# Patient Record
Sex: Male | Born: 1955 | Race: White | Hispanic: No | Marital: Married | Smoking: Former smoker
Health system: Southern US, Community
[De-identification: ages and names within clinical notes are randomized; demographics above are authoritative.]

## PROBLEM LIST (undated history)

## (undated) DIAGNOSIS — L309 Dermatitis, unspecified: Secondary | ICD-10-CM

## (undated) DIAGNOSIS — I10 Essential (primary) hypertension: Secondary | ICD-10-CM

## (undated) DIAGNOSIS — K900 Celiac disease: Secondary | ICD-10-CM

## (undated) DIAGNOSIS — R1033 Periumbilical pain: Secondary | ICD-10-CM

## (undated) DIAGNOSIS — K5090 Crohn's disease, unspecified, without complications: Secondary | ICD-10-CM

## (undated) DIAGNOSIS — K219 Gastro-esophageal reflux disease without esophagitis: Secondary | ICD-10-CM

## (undated) DIAGNOSIS — R5383 Other fatigue: Secondary | ICD-10-CM

## (undated) HISTORY — DX: Gastro-esophageal reflux disease without esophagitis: K21.9

## (undated) HISTORY — DX: Periumbilical pain: R10.33

## (undated) HISTORY — PX: BACK SURGERY: SHX140

## (undated) HISTORY — DX: Essential (primary) hypertension: I10

## (undated) HISTORY — DX: Dermatitis, unspecified: L30.9

## (undated) HISTORY — DX: Other fatigue: R53.83

## (undated) HISTORY — PX: CHOLECYSTECTOMY: SHX55

## (undated) HISTORY — PX: APPENDECTOMY: SHX54

## (undated) HISTORY — DX: Celiac disease: K90.0

---

## 1997-12-15 ENCOUNTER — Ambulatory Visit (HOSPITAL_COMMUNITY): Admission: RE | Admit: 1997-12-15 | Discharge: 1997-12-15 | Payer: Self-pay | Admitting: Neurosurgery

## 1998-01-31 ENCOUNTER — Ambulatory Visit (HOSPITAL_COMMUNITY): Admission: RE | Admit: 1998-01-31 | Discharge: 1998-01-31 | Payer: Self-pay | Admitting: Neurosurgery

## 2003-08-18 ENCOUNTER — Ambulatory Visit (HOSPITAL_COMMUNITY): Admission: RE | Admit: 2003-08-18 | Discharge: 2003-08-19 | Payer: Self-pay | Admitting: Neurosurgery

## 2013-09-07 HISTORY — PX: COLONOSCOPY: SHX174

## 2013-11-22 HISTORY — PX: FLEXIBLE SIGMOIDOSCOPY: SHX1649

## 2014-06-13 ENCOUNTER — Encounter (INDEPENDENT_AMBULATORY_CARE_PROVIDER_SITE_OTHER): Payer: Self-pay | Admitting: *Deleted

## 2014-06-21 ENCOUNTER — Encounter (INDEPENDENT_AMBULATORY_CARE_PROVIDER_SITE_OTHER): Payer: Self-pay | Admitting: *Deleted

## 2014-06-26 ENCOUNTER — Institutional Professional Consult (permissible substitution) (INDEPENDENT_AMBULATORY_CARE_PROVIDER_SITE_OTHER): Payer: Self-pay | Admitting: Internal Medicine

## 2014-08-01 ENCOUNTER — Encounter (INDEPENDENT_AMBULATORY_CARE_PROVIDER_SITE_OTHER): Payer: Self-pay | Admitting: Internal Medicine

## 2014-08-01 ENCOUNTER — Ambulatory Visit (INDEPENDENT_AMBULATORY_CARE_PROVIDER_SITE_OTHER): Payer: BLUE CROSS/BLUE SHIELD | Admitting: Internal Medicine

## 2014-08-01 VITALS — BP 116/76 | HR 74 | Temp 97.6°F | Resp 18 | Ht 67.0 in | Wt 194.9 lb

## 2014-08-01 DIAGNOSIS — I10 Essential (primary) hypertension: Secondary | ICD-10-CM | POA: Insufficient documentation

## 2014-08-01 DIAGNOSIS — M1710 Unilateral primary osteoarthritis, unspecified knee: Secondary | ICD-10-CM | POA: Insufficient documentation

## 2014-08-01 DIAGNOSIS — M179 Osteoarthritis of knee, unspecified: Secondary | ICD-10-CM | POA: Insufficient documentation

## 2014-08-01 DIAGNOSIS — Z8719 Personal history of other diseases of the digestive system: Secondary | ICD-10-CM | POA: Insufficient documentation

## 2014-08-01 DIAGNOSIS — K5732 Diverticulitis of large intestine without perforation or abscess without bleeding: Secondary | ICD-10-CM | POA: Insufficient documentation

## 2014-08-01 DIAGNOSIS — K219 Gastro-esophageal reflux disease without esophagitis: Secondary | ICD-10-CM | POA: Insufficient documentation

## 2014-08-01 NOTE — Patient Instructions (Addendum)
Physician will contact you after endoscopic pictures reviewed. Keep ibuprofen or Motrin used a minimum;

## 2014-08-01 NOTE — Progress Notes (Signed)
Reason for consultation.  History of abdominal pain and absent normal findings on colonoscopy initially thought to be colon cancer.  History of present illness;  Nicholas Ellison 59-year-old Caucasian male who was referred for car seat of Dr. Whitney Muse for GI evaluation. A shunt is accompanied by his daughter who is an Therapist, sports at [employer]. Patient states his present illness started in March 2015 when he began to notice blood with his bowel movements small in amounts and bright red in color. He also started to have left lower quadrant abdominal pain which generally would occur after meals and associated with diarrhea. He was evaluated by Dr. Lance Bosch and referred to Dr. Doristine Mango for colonoscopy. He underwent colonoscopy on 09/07/2013 and was told that he had colon cancer based on visual/endoscopic impression. Biopsy from this area revealed acute colitis with mild architectural changes and hyperemia but no tumor was identified. Patient was treated with antibiotics and he was brought back for flexible sigmoidoscopy which was performed on 11/23/2014. This time Dr. Britta Mccreedy noted inflammation in the sigmoid colon with thickened folds associated submucosal hemorrhages and moderate diverticulosis. Biopsy was taken again revealing mild inflammation is felt to be nonspecific. Patient was retreated with Cipro and Flagyl with resolution of his abdominal pain. He he is not passing blood per rectum anymore. Patient, his wife and the daughter remain very concerned about discrepancy in diagnosis and they want to be absolutely certain that there is no malignancy. He has mild intermittent postprandial LLQ abdominal pain but nothing like last year. He has good appetite and his weight has been stable. He has been taking probiotic. He has left knee pain. He uses Motrin sporadically. His daughter believes he was taking NSAIDs quite regularly when his symptoms started last year. He denies heartburn nausea vomiting hematuria or  dysuria.   Current Medications: Outpatient Encounter Prescriptions as of 08/01/2014  Medication Sig  . ibuprofen (ADVIL,MOTRIN) 200 MG tablet Take 200 mg by mouth as needed.  [name] losartan (COZAAR) 50 MG tablet Take 50 mg by mouth daily.  [name] omeprazole (PRILOSEC) 20 MG capsule Take 20 mg by mouth daily.  . Probiotic Product (PROBIOTIC PO) Take by mouth daily. Gummy form   Past medical history;  He had appendicectomy over 60 years ago. Cholecystectomy for cholelithiasis several years ago. History of kidney stones. He has passed stones but on one occasion he had ureteral stenting. GERD of 20 years duration. Hypertension of 5 venous duration. Left knee osteoarthrosis. Status post arthroscopy in 2015. Neck surgery for disc disease. Lumbar spine surgery for disc disease. Had few small skin cancers removed from his face. Left plantar fasciitis  Allergies;  Allergies  Allergen Reactions  . Sulfa Antibiotics Itching    Family history;  Father is 60 years old and has hypertension. Mother is 60 and has hypertension and diabetes. He has 1 brother with hypertension. He has 4 sisters. 3 sisters are in good health. For sister died of metastatic carcinoma at age 70 within few months of diagnosis.  Social history;  He is married and has one daughter in good health. He smokes cigarettes for 35 years about a pack a day but quit 6 years ago. He used to drink alcohol socially but not daily but he hasn't had any in 5 areas. 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  Physical exam; Blood pressure 116/76, pulse 74, temperature 97.6 F (36.4 C), temperature source Oral, resp. rate  18, height 5' 7"  (1.702 m), weight 194 lb 14.4 oz (88.406 kg). Patient is alert and in no acute distress. Conjunctiva is pink. Sclera is nonicteric Oropharyngeal mucosa is normal. No neck masses or thyromegaly noted. Cardiac exam with regular rhythm  normal S1 and S2. No murmur or gallop noted. Lungs are clear to auscultation. Abdomen is full. Bowel sounds are normal. No bruits noted. On palpation abdomen is soft with minimal tenderness in left lower quadrant on deep palpation. No organomegaly or masses.  No LE edema or clubbing noted.  Labs/studies Results:   lab data from 02/10/14 reviewed; WBC 6.9, H&H 14.0 and 39.2 and platelet count 285K   Colonoscopy and flexible sigmoidoscopy notes reviewed. Pictures in black and white and difficult to discern.  Assessment:  Patient is 59 year old Caucasian male who presented last year with left lower quadrant abdominal pain and hematochezia and on colonoscopy was thought to have colonic cancer but biopsy revealed colitis. This exam was in May 2015. He was treated with antibiotics and follow-up flexible sigmoidoscopy about 10 weeks later suggested changes of diverticulitis. He was retreated with antibiotics with near complete resolution of his pain. He is very concerned because initially he and his family were told that he had colon cancer. I suspect colonoscopy revealed pseudo or inflammatory mass may mimicking neoplasm but none found on follow-up study. Since this has left some doubt in patient and his family, he should undergo repeat flexible sigmoidoscopy to resolve his confusion. In hindsight I would think that he had both colitis and diverticulitis possibly caused by NSAID therapy.   Recommendations;  Patient advised to keep ibuprofen used a minimum. Will review endoscopic pictures. Will arrange for diagnostic flexible sigmoidoscopy with sedation and near future.

## 2014-08-08 ENCOUNTER — Other Ambulatory Visit (INDEPENDENT_AMBULATORY_CARE_PROVIDER_SITE_OTHER): Payer: Self-pay | Admitting: Internal Medicine

## 2014-08-08 DIAGNOSIS — K5732 Diverticulitis of large intestine without perforation or abscess without bleeding: Secondary | ICD-10-CM

## 2014-08-08 DIAGNOSIS — R109 Unspecified abdominal pain: Secondary | ICD-10-CM

## 2014-08-10 ENCOUNTER — Encounter (INDEPENDENT_AMBULATORY_CARE_PROVIDER_SITE_OTHER): Payer: Self-pay | Admitting: *Deleted

## 2014-08-10 ENCOUNTER — Telehealth (INDEPENDENT_AMBULATORY_CARE_PROVIDER_SITE_OTHER): Payer: Self-pay | Admitting: *Deleted

## 2014-08-10 ENCOUNTER — Ambulatory Visit (HOSPITAL_COMMUNITY)
Admission: RE | Admit: 2014-08-10 | Discharge: 2014-08-10 | Disposition: A | Discharge status: Home / Self Care | Payer: BLUE CROSS/BLUE SHIELD | Source: Ambulatory Visit | Attending: Internal Medicine | Admitting: Internal Medicine

## 2014-08-10 ENCOUNTER — Other Ambulatory Visit (INDEPENDENT_AMBULATORY_CARE_PROVIDER_SITE_OTHER): Payer: Self-pay | Admitting: *Deleted

## 2014-08-10 DIAGNOSIS — K760 Fatty (change of) liver, not elsewhere classified: Secondary | ICD-10-CM | POA: Diagnosis not present

## 2014-08-10 DIAGNOSIS — R935 Abnormal findings on diagnostic imaging of other abdominal regions, including retroperitoneum: Secondary | ICD-10-CM

## 2014-08-10 DIAGNOSIS — R1030 Lower abdominal pain, unspecified: Secondary | ICD-10-CM | POA: Diagnosis present

## 2014-08-10 DIAGNOSIS — R599 Enlarged lymph nodes, unspecified: Secondary | ICD-10-CM | POA: Insufficient documentation

## 2014-08-10 DIAGNOSIS — R109 Unspecified abdominal pain: Secondary | ICD-10-CM

## 2014-08-10 DIAGNOSIS — Z8719 Personal history of other diseases of the digestive system: Secondary | ICD-10-CM | POA: Insufficient documentation

## 2014-08-10 DIAGNOSIS — R933 Abnormal findings on diagnostic imaging of other parts of digestive tract: Secondary | ICD-10-CM | POA: Insufficient documentation

## 2014-08-10 DIAGNOSIS — Z1211 Encounter for screening for malignant neoplasm of colon: Secondary | ICD-10-CM

## 2014-08-10 DIAGNOSIS — I2510 Atherosclerotic heart disease of native coronary artery without angina pectoris: Secondary | ICD-10-CM | POA: Insufficient documentation

## 2014-08-10 DIAGNOSIS — K5730 Diverticulosis of large intestine without perforation or abscess without bleeding: Secondary | ICD-10-CM | POA: Insufficient documentation

## 2014-08-10 DIAGNOSIS — K5732 Diverticulitis of large intestine without perforation or abscess without bleeding: Secondary | ICD-10-CM

## 2014-08-10 MED ORDER — IOHEXOL 300 MG/ML  SOLN
100.0000 mL | Freq: Once | INTRAMUSCULAR | Status: AC | PRN
  Administered 2014-08-10: 100 mL via INTRAVENOUS

## 2014-08-10 MED ORDER — PEG 3350-KCL-NA BICARB-NACL 420 G PO SOLR: 4000.0000 mL | Freq: Once | ORAL | Status: DC

## 2014-08-10 NOTE — Telephone Encounter (Signed)
Patients needs trilyte

## 2014-08-14 ENCOUNTER — Encounter (HOSPITAL_COMMUNITY)
Admission: RE | Disposition: A | Discharge status: Home / Self Care | Payer: Self-pay | Source: Ambulatory Visit | Attending: Internal Medicine

## 2014-08-14 ENCOUNTER — Ambulatory Visit (HOSPITAL_COMMUNITY)
Admission: RE | Admit: 2014-08-14 | Discharge: 2014-08-14 | Disposition: A | Discharge status: Home / Self Care | Payer: BLUE CROSS/BLUE SHIELD | Source: Ambulatory Visit | Attending: Internal Medicine | Admitting: Internal Medicine

## 2014-08-14 ENCOUNTER — Encounter (HOSPITAL_COMMUNITY): Payer: Self-pay | Admitting: *Deleted

## 2014-08-14 DIAGNOSIS — Z79899 Other long term (current) drug therapy: Secondary | ICD-10-CM | POA: Insufficient documentation

## 2014-08-14 DIAGNOSIS — R1030 Lower abdominal pain, unspecified: Secondary | ICD-10-CM | POA: Diagnosis present

## 2014-08-14 DIAGNOSIS — I10 Essential (primary) hypertension: Secondary | ICD-10-CM | POA: Insufficient documentation

## 2014-08-14 DIAGNOSIS — K529 Noninfective gastroenteritis and colitis, unspecified: Secondary | ICD-10-CM | POA: Insufficient documentation

## 2014-08-14 DIAGNOSIS — K648 Other hemorrhoids: Secondary | ICD-10-CM | POA: Diagnosis not present

## 2014-08-14 DIAGNOSIS — K6389 Other specified diseases of intestine: Secondary | ICD-10-CM | POA: Diagnosis not present

## 2014-08-14 DIAGNOSIS — K644 Residual hemorrhoidal skin tags: Secondary | ICD-10-CM | POA: Diagnosis not present

## 2014-08-14 DIAGNOSIS — K219 Gastro-esophageal reflux disease without esophagitis: Secondary | ICD-10-CM | POA: Diagnosis not present

## 2014-08-14 DIAGNOSIS — R933 Abnormal findings on diagnostic imaging of other parts of digestive tract: Secondary | ICD-10-CM | POA: Diagnosis not present

## 2014-08-14 DIAGNOSIS — K5730 Diverticulosis of large intestine without perforation or abscess without bleeding: Secondary | ICD-10-CM | POA: Insufficient documentation

## 2014-08-14 DIAGNOSIS — R935 Abnormal findings on diagnostic imaging of other abdominal regions, including retroperitoneum: Secondary | ICD-10-CM

## 2014-08-14 HISTORY — PX: COLONOSCOPY: SHX5424

## 2014-08-14 SURGERY — COLONOSCOPY
Anesthesia: Moderate Sedation

## 2014-08-14 MED ORDER — SIMETHICONE 40 MG/0.6ML PO SUSP
ORAL | Status: DC | PRN
  Administered 2014-08-14: 08:00:00

## 2014-08-14 MED ORDER — MEPERIDINE HCL 50 MG/ML IJ SOLN
INTRAMUSCULAR | Status: DC | PRN
  Administered 2014-08-14 (×2): 25 mg via INTRAVENOUS

## 2014-08-14 MED ORDER — SODIUM CHLORIDE 0.9 % IV SOLN
INTRAVENOUS | Status: DC
  Administered 2014-08-14: 1000 mL via INTRAVENOUS

## 2014-08-14 MED ORDER — MIDAZOLAM HCL 5 MG/5ML IJ SOLN
INTRAMUSCULAR | Status: AC
  Filled 2014-08-14: qty 10

## 2014-08-14 MED ORDER — MEPERIDINE HCL 50 MG/ML IJ SOLN
INTRAMUSCULAR | Status: AC
  Filled 2014-08-14: qty 1

## 2014-08-14 MED ORDER — DOCUSATE SODIUM 100 MG PO CAPS: 200.0000 mg | ORAL_CAPSULE | Freq: Every day | ORAL | Status: DC

## 2014-08-14 MED ORDER — BENEFIBER DRINK MIX PO PACK: 4.0000 g | PACK | Freq: Every day | ORAL | Status: DC

## 2014-08-14 MED ORDER — MIDAZOLAM HCL 5 MG/5ML IJ SOLN
INTRAMUSCULAR | Status: DC | PRN
  Administered 2014-08-14 (×3): 2 mg via INTRAVENOUS

## 2014-08-14 NOTE — H&P (Signed)
Nicholas Ellison is an 59 y.o. male.   Chief Complaint: Patient is here for colonoscopy. HPI: Patient is 59 year-old Caucasian male who presented with lower abdominal pain and rectal bleeding last year underwent colonoscopy. He was felt to have colonic neoplasm is an endoscopic findings with biopsy showed colitis. He was treated with antibiotics his symptoms at all. He had follow-up flexible sigmoidoscopy in July and this time felt to have diverticulitis. Patient is referred to her office for another opinion. I reviewed patient's prior CT and felt abnormality was significant enough to be followed up. On repeat CT has persistent wall thickening and sigmoid colon and prominent lymph nodes. He has had intermittent mild lower abdominal pain but denies rectal bleeding anorexia or weight loss.  Past Medical History  Diagnosis Date  . Hypertension   . GERD (gastroesophageal reflux disease)   . Fatigue   . Abdominal pain, periumbilical   . Eczema     Past Surgical History  Procedure Laterality Date  . Appendectomy    . Back surgery      Cervical Neck Fusion , Low Back  . Cholecystectomy    . Flexible sigmoidoscopy  11/22/13  . Colonoscopy  09/07/13    Family History  Problem Relation Age of Onset  . Hypertension Mother   . Diabetes Mother   . Hypertension Father   . Healthy Sister   . Healthy Brother   . Healthy Paternal Grandfather   . Healthy Sister   . Healthy Sister   . Bone cancer Sister    Social History:  reports that he has quit smoking. He started smoking about 6 years ago. His smokeless tobacco use includes Snuff. He reports that he does not drink alcohol or use illicit drugs.  Allergies:  Allergies  Allergen Reactions  . Sulfa Antibiotics Itching    Medications Prior to Admission  Medication Sig Dispense Refill  . ibuprofen (ADVIL,MOTRIN) 200 MG tablet Take 200 mg by mouth as needed.    [name] losartan (COZAAR) 50 MG tablet Take 50 mg by mouth daily.    [name] omeprazole  (PRILOSEC) 20 MG capsule Take 20 mg by mouth daily.    . polyethylene glycol-electrolytes (NULYTELY/GOLYTELY) 420 G solution Take 4,000 mLs by mouth once. 4000 mL 0  . Probiotic Product (PROBIOTIC PO) Take by mouth daily. Gummy form      No results found for this or any previous visit (from the past 48 hour(s)). No results found.  ROS  Blood pressure 146/89, pulse 77, temperature 97.7 F (36.5 C), temperature source Oral, resp. rate 18, SpO2 95 %. Physical Exam  Constitutional: He appears well-developed and well-nourished.  HENT:  Mouth/Throat: Oropharynx is clear and moist.  Eyes: Conjunctivae are normal. No scleral icterus.  Neck: No thyromegaly present.  Cardiovascular: Normal rate, regular rhythm and normal heart sounds.   No murmur heard. Respiratory: Effort normal and breath sounds normal.  GI: Soft. He exhibits no distension and no mass. There is no tenderness.  Musculoskeletal: He exhibits no edema.  Lymphadenopathy:    He has no cervical adenopathy.  Neurological: He is alert.  Skin: Skin is warm and dry.     Assessment/Plan Lower abdominal pain. Distant CT abnormality and sigmoid colon suspicious for carcinoma. Diagnostic colonoscopy.  REHMAN,NAJEEB U 08/14/2014, 7:33 AM

## 2014-08-14 NOTE — Op Note (Signed)
COLONOSCOPY PROCEDURE REPORT  PATIENT:  Nicholas Ellison  MR#:  [id number] Birthdate:  [birth year], 59 y.o., male Endoscopist:  Dr. Rogene Houston, MD Referred By:  Dr. Manon Hilding, MD Procedure Date: 08/14/2014  Procedure:   Colonoscopy  Indications: Patient is 59 year old Caucasian male who has intermittent lower abdominal pain and persistent abnormality in sigmoid colon on CT. 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 Follow-up flexible sigmoidoscopy in July 2015 revealed changes of diverticulitis([facility]).  Informed Consent:  The procedure and risks were reviewed with the patient and informed consent was obtained.  Medications:  Demerol 50 mg IV Versed 6 mg IV  Description of procedure:  After a digital rectal exam was performed, that colonoscope was advanced from the anus through the rectum and colon to the area of the cecum, ileocecal valve and appendiceal orifice. The cecum was deeply intubated. These structures were well-seen and photographed for the record. From the level of the cecum and ileocecal valve, the scope was slowly and cautiously withdrawn. The mucosal surfaces were carefully surveyed utilizing scope tip to flexion to facilitate fold flattening as needed. The scope was pulled down into the rectum where a thorough exam including retroflexion was performed.  Findings:   Prep satisfactory. He had formed stool and rectum. Multiple diverticula noted at sigmoid colon. Large polypoidal fold with erythematous friable mucosa at 30 cm from anal margin. Multiple biopsies taken. Folds distal to this area revealed edema and erythema but not as pronounced. Multiple biopsies taken. Normal rectal mucosa. Small hemorrhoids below the dentate line along with anal papilla.   Therapeutic/Diagnostic Maneuvers Performed:  See above  Complications:  None  Cecal Withdrawal Time:  24 minutes  Impression:  Examination performed to  cecum. Multiple diverticula at sigmoid colon. Polypoid fold with erythema and friable mucosa at mid sigmoid colon. Endoscopic appearance not consistent with colon carcinoma but suggests focal colitis or submucosal process. Focal colitis noted involving mucosa of distal sigmoid colon distal to this area in question. Small external hemorrhoids and anal papilla.  Comment; No evidence of CRC based on endoscopic findings.   Recommendations:  Standard instructions given. High fiber diet. Benefiber 4 g by mouth daily at bedtime. Colace 200 mg by mouth daily at bedtime. I will contact patient with biopsy results and further recommendations.  REHMAN,NAJEEB U  08/14/2014 8:24 AM  CC: Dr. Manon Hilding, MD & Dr. Rayne Du ref. provider found

## 2014-08-14 NOTE — Discharge Instructions (Signed)
Resume usual medications. Keep ibuprofen use to minimum. Colace 200 mg by mouth daily at bedtime High fiber diet. No driving for 24 hours Physician will call with biopsy results  Colonoscopy, Care After Refer to this sheet in the next few weeks. These instructions provide you with information on caring for yourself after your procedure. Your health care provider may also give you more specific instructions. Your treatment has been planned according to current medical practices, but problems sometimes occur. Call your health care provider if you have any problems or questions after your procedure. WHAT TO EXPECT AFTER THE PROCEDURE  After your procedure, it is typical to have the following:  A small amount of blood in your stool.  Moderate amounts of gas and mild abdominal cramping or bloating. HOME CARE INSTRUCTIONS  Do not drive, operate machinery, or sign important documents for 24 hours.  You may shower and resume your regular physical activities, but move at a slower pace for the first 24 hours.  Take frequent rest periods for the first 24 hours.  Walk around or put a warm pack on your abdomen to help reduce abdominal cramping and bloating.  Drink enough fluids to keep your urine clear or pale yellow.  You may resume your normal diet as instructed by your health care provider. Avoid heavy or fried foods that are hard to digest.  Avoid drinking alcohol for 24 hours or as instructed by your health care provider.  Only take over-the-counter or prescription medicines as directed by your health care provider.  If a tissue sample (biopsy) was taken during your procedure:  Do not take aspirin or blood thinners for 7 days, or as instructed by your health care provider.  Do not drink alcohol for 7 days, or as instructed by your health care provider.  Eat soft foods for the first 24 hours. SEEK MEDICAL CARE IF: You have persistent spotting of blood in your stool 2-3 days after the  procedure. SEEK IMMEDIATE MEDICAL CARE IF:  You have more than a small spotting of blood in your stool.  You pass large blood clots in your stool.  Your abdomen is swollen (distended).  You have nausea or vomiting.  You have a fever.  You have increasing abdominal pain that is not relieved with medicine. Document Released: 12/04/2003 Document Revised: 02/09/2013 Document Reviewed: 12/27/2012 [facility] Patient Information 2015 [place], [place]. This information is not intended to replace advice given to you by your health care provider. Make sure you discuss any questions you have with your health care provider.   High-Fiber Diet Fiber is found in fruits, vegetables, and grains. A high-fiber diet encourages the addition of more whole grains, legumes, fruits, and vegetables in your diet. The recommended amount of fiber for adult males is 38 g per day. For adult females, it is 25 g per day. Pregnant and lactating women should get 28 g of fiber per day. If you have a digestive or bowel problem, ask your caregiver for advice before adding high-fiber foods to your diet. Eat a variety of high-fiber foods instead of only a select few type of foods.  PURPOSE  To increase stool bulk.  To make bowel movements more regular to prevent constipation.  To lower cholesterol.  To prevent overeating. WHEN IS THIS DIET USED?  It may be used if you have constipation and hemorrhoids.  It may be used if you have uncomplicated diverticulosis (intestine condition) and irritable bowel syndrome.  It may be used if you need  help with weight management.  It may be used if you want to add it to your diet as a protective measure against atherosclerosis, diabetes, and cancer. SOURCES OF FIBER  Whole-grain breads and cereals.  Fruits, such as apples, oranges, bananas, berries, prunes, and pears.  Vegetables, such as green peas, carrots, sweet potatoes, beets, broccoli, cabbage, spinach, and  artichokes.  Legumes, such split peas, soy, lentils.  Almonds. FIBER CONTENT IN FOODS Starches and Grains / Dietary Fiber (g)  Cheerios, 1 cup / 3 g  Corn Flakes cereal, 1 cup / 0.7 g  Rice crispy treat cereal, 1 cup / 0.3 g  Instant oatmeal (cooked),  cup / 2 g  Frosted wheat cereal, 1 cup / 5.1 g  Brown, long-grain rice (cooked), 1 cup / 3.5 g  White, long-grain rice (cooked), 1 cup / 0.6 g  Enriched macaroni (cooked), 1 cup / 2.5 g Legumes / Dietary Fiber (g)  Baked beans (canned, plain, or vegetarian),  cup / 5.2 g  Kidney beans (canned),  cup / 6.8 g  Pinto beans (cooked),  cup / 5.5 g Breads and Crackers / Dietary Fiber (g)  Plain or honey graham crackers, 2 squares / 0.7 g  Saltine crackers, 3 squares / 0.3 g  Plain, salted pretzels, 10 pieces / 1.8 g  Whole-wheat bread, 1 slice / 1.9 g  White bread, 1 slice / 0.7 g  Raisin bread, 1 slice / 1.2 g  Plain bagel, 3 oz / 2 g  Flour tortilla, 1 oz / 0.9 g  Corn tortilla, 1 small / 1.5 g  Hamburger or hotdog bun, 1 small / 0.9 g Fruits / Dietary Fiber (g)  Apple with skin, 1 medium / 4.4 g  Sweetened applesauce,  cup / 1.5 g  Banana,  medium / 1.5 g  Grapes, 10 grapes / 0.4 g  Orange, 1 small / 2.3 g  Raisin, 1.5 oz / 1.6 g  Melon, 1 cup / 1.4 g Vegetables / Dietary Fiber (g)  Green beans (canned),  cup / 1.3 g  Carrots (cooked),  cup / 2.3 g  Broccoli (cooked),  cup / 2.8 g  Peas (cooked),  cup / 4.4 g  Mashed potatoes,  cup / 1.6 g  Lettuce, 1 cup / 0.5 g  Corn (canned),  cup / 1.6 g  Tomato,  cup / 1.1 g Document Released: 04/21/2005 Document Revised: 10/21/2011 Document Reviewed: 07/24/2011 [employer] Patient Information 2015 [place], [place]. This information is not intended to replace advice given to you by your health care provider. Make sure you discuss any questions you have with your health care provider.

## 2014-08-17 ENCOUNTER — Encounter (HOSPITAL_COMMUNITY): Payer: Self-pay | Admitting: Internal Medicine

## 2014-08-17 ENCOUNTER — Encounter (INDEPENDENT_AMBULATORY_CARE_PROVIDER_SITE_OTHER): Payer: Self-pay

## 2014-08-21 ENCOUNTER — Encounter (INDEPENDENT_AMBULATORY_CARE_PROVIDER_SITE_OTHER): Payer: Self-pay | Admitting: *Deleted

## 2015-02-07 ENCOUNTER — Encounter (INDEPENDENT_AMBULATORY_CARE_PROVIDER_SITE_OTHER): Payer: Self-pay | Admitting: *Deleted

## 2015-02-07 NOTE — Telephone Encounter (Signed)
This encounter was created in error - please disregard.

## 2015-02-13 ENCOUNTER — Encounter (INDEPENDENT_AMBULATORY_CARE_PROVIDER_SITE_OTHER): Payer: Self-pay | Admitting: Internal Medicine

## 2015-02-13 ENCOUNTER — Ambulatory Visit (INDEPENDENT_AMBULATORY_CARE_PROVIDER_SITE_OTHER): Payer: BLUE CROSS/BLUE SHIELD | Admitting: Internal Medicine

## 2015-02-13 VITALS — BP 126/86 | HR 78 | Temp 97.8°F | Resp 18 | Ht 67.0 in | Wt 185.9 lb

## 2015-02-13 DIAGNOSIS — K50119 Crohn's disease of large intestine with unspecified complications: Secondary | ICD-10-CM | POA: Diagnosis not present

## 2015-02-13 DIAGNOSIS — K50118 Crohn's disease of large intestine with other complication: Secondary | ICD-10-CM

## 2015-02-13 DIAGNOSIS — K5010 Crohn's disease of large intestine without complications: Secondary | ICD-10-CM | POA: Insufficient documentation

## 2015-02-13 NOTE — Patient Instructions (Signed)
PPD to be performed Dr. Edythe Lynn office. Patient will call with results of tests for hepatitis B and C. Will send request to your insurance for Remicade approval as soon as above tests are completed

## 2015-02-13 NOTE — Progress Notes (Signed)
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  Database:  97 59 year old Caucasian male whom I last saw in March this year for lower abdominal pain and abnormal colonoscopy. He was initially thought to have colon carcinoma but biopsies show focal colitis. He underwent flexible sigmoidoscopy suggesting changes of diverticulitis but he did not respond to anti-biotics. Abdominopelvic CT was obtained on 08/10/2014 and revealing abnormality to distal sigmoid colon suspicious for neoplasm. He also had extensive colonic diverticulosis. Another colonoscopy was performed on 08/14/2014 revealing segmental colitis involving sigmoid colon and diverticulosis. Since patient had failed medical therapy and there was concern for submucosal process he was referred to Dr. Cheryll Cockayne of [facility] for sigmoid colon resection. A she was evaluated by hem and subsequently by Dr. Eduard Roux of GI division. Repeat CT not only showed changes in the sigmoid colon but also in the transverse and ascending colon. Therefore there was concern for for Crohn's colitis. Repeat colonoscopy on 12/10/2014 revealed changes in the sigmoid colon but not in a sending the transverse colon and biopsies are reviewed below. Patient was treated with budesonide for several weeks but without symptomatic improvement. He stopped using mesalamine enemas after couple of doses because he could not tolerate them. Patient was advised biologic therapy prior to considering surgery. Patient opted to undergo this treatment in [place] and therefore I was contacted by both Dr. Morton Stall and Dr. Nyoka Cowden to arrange this.  Subjective:  Patient is here for scheduled visit accompanied by his wife. They're both very frustrated because he's been having symptoms since made 2015. He has good appetite but limits his by mouth intake because eating causes abdominal pain and diarrhea. He has lost 9 pounds since his 08/01/2014 visit.  He has anywhere from 5-10 stools per day. Most of her stools are loose. He denies melena or rectal bleeding. He has lower abdominal cramping every day. He did have fever and chills on 02/02/2015 when he another CT at [facility]. Family history is negative for inflammatory bowel disease but his mother had rheumatoid arthritis.   Current Medications: Outpatient Encounter Prescriptions as of 02/13/2015  Medication Sig  . Acetaminophen (TYLENOL PO) Take 375 mg by mouth. Patient will take 2-3 daily when needed only  . losartan (COZAAR) 50 MG tablet Take 50 mg by mouth daily.  [name] omeprazole (PRILOSEC) 20 MG capsule Take 20 mg by mouth daily.  . polyethylene glycol (MIRALAX / GLYCOLAX) packet Take 17 g by mouth daily.  . Probiotic Product (PROBIOTIC PO) Take by mouth daily. Gummy form  . Wheat Dextrin (BENEFIBER DRINK MIX) PACK Take 4 g by mouth at bedtime.  . [DISCONTINUED] docusate sodium (COLACE) 100 MG capsule Take 2 capsules (200 mg total) by mouth daily. (Patient not taking: Reported on 02/13/2015)  . [DISCONTINUED] ibuprofen (ADVIL,MOTRIN) 200 MG tablet Take 200 mg by mouth as needed.   No facility-administered encounter medications on file as of 02/13/2015.     Objective: Blood pressure 126/86, pulse 78, temperature 97.8 F (36.6 C), temperature source Oral, resp. rate 18, height 5' 7"  (1.702 m), weight 185 lb 14.4 oz (84.324 kg).  Patient is alert and in no acute distress. Conjunctiva is pink. Sclera is nonicteric Oropharyngeal mucosa is normal. No neck masses or thyromegaly noted. Cardiac exam with regular rhythm normal S1 and S2. No murmur or gallop noted. Lungs are clear to auscultation. Abdomen is symmetrical. Bowel sounds are normal. He has mild tenderness in left lower quadrant and hypogastric area. No organomegaly or  masses noted.  No LE edema or clubbing noted.  Labs/studies Results: Results of colonic biopsies from 12/10/2014 ([facility]-Dr. Eduard Roux). Right colon biopsy colonic  mucosa with no diagnostic abnormality. Left colon biopsy colonic mucosa with no diagnostic abnormality. Sigmoid colon biopsy. Colonic mucosa with chronic active inflammation most consistent with segmental colitis associated with diverticulosis.    Assessment:  #1. Segmental colitis associated with diverticulosis unresponsive to several week course of budesonide and few doses of rectal mesalamine which she could not tolerate. He previously has also been treated with antibiotics. Biologic therapy was advised by Dr. Cheryll Cockayne and Dr. Eduard Roux which he would prefer to have locally. Once is documented to have negative PPD and hepatitis B surface antigen is negative will send request for medication to his insurance.    Plan:  Patient will go to Dr. Edythe Lynn office for PPD. Check hepatitis B surface antigen and HCV antibody. Once these tests are completed and are negative will send request to is insurance for infliximab for induction and maintenance therapy. Office visit in 8 weeks at which time he would hopefully have completed 3 doses for induction therapy.

## 2015-02-14 LAB — HEPATITIS C ANTIBODY: HCV AB: NEGATIVE

## 2015-02-14 LAB — HEPATITIS B SURFACE ANTIGEN: HEP B S AG: NEGATIVE

## 2015-02-19 ENCOUNTER — Other Ambulatory Visit (INDEPENDENT_AMBULATORY_CARE_PROVIDER_SITE_OTHER): Payer: Self-pay | Admitting: *Deleted

## 2015-02-19 ENCOUNTER — Telehealth (INDEPENDENT_AMBULATORY_CARE_PROVIDER_SITE_OTHER): Payer: Self-pay | Admitting: *Deleted

## 2015-02-19 MED ORDER — HYDROCODONE-ACETAMINOPHEN 5-325 MG PO TABS: 1.0000 | ORAL_TABLET | Freq: Three times a day (TID) | ORAL | Status: DC

## 2015-02-19 NOTE — Telephone Encounter (Signed)
Per Dr.Rehman may call in this medication for the patient.

## 2015-02-19 NOTE — Telephone Encounter (Signed)
Talked with daughter, She states that she saw her Dad this weekend. She has never seen him be so sick. Patient eats 2 bites at breakfast , 2 bites a lunch. He brings him to his knees , 5 minutes later he is in bathroom with diarrhea. She was made aware that we are working on the Remicade infusions.  Per Dr.Rehman - this is following the recommendations of [facility]. He will need to have the Remicade before surgery. May also call in Hydrocodone 5/325 mg take 1 by mouth with each meal. # 60.

## 2015-02-19 NOTE — Telephone Encounter (Signed)
Sheryl's daughter, Anderson [place], called and would like to speak with Tammy. She is very concerned about her dad and is not convenienced that his problem is Chrohn's. He is losing a lot of weight and after he takes one bite of food, he will double over with pain. He also tried to work and he can not like he is doing now. The return phone number is [phone].

## 2015-03-02 ENCOUNTER — Telehealth (INDEPENDENT_AMBULATORY_CARE_PROVIDER_SITE_OTHER): Payer: Self-pay | Admitting: *Deleted

## 2015-03-02 NOTE — Telephone Encounter (Signed)
Patient's PA was sent to Pharmacy on 02/21/15. This was marked urgent for review. I followed up with patient's insurance , [employer], on 02/28/15. I spoke with two representatives. The last one stated that he was there and was awaiting review by the nurse. I explained that this had been sent and rec'd marked urgent on 02/21/15. I was told that it was being marked as we spoke,urgent and that a nurse would review and our office should rec'v a call within 24 hours. I stressed the importance of a decision asap, the patient was sick and needed to have this medicine. Again, I was assured that we would know something in 24 hours.  I did call the patient's wife 03/01/15 , message was left. Explaining all that had taken place. I ask that she call the insurance also and provided this reference number for patient's PA -[id number].

## 2015-03-09 ENCOUNTER — Encounter (HOSPITAL_COMMUNITY)
Admission: RE | Admit: 2015-03-09 | Discharge: 2015-03-09 | Disposition: A | Discharge status: Home / Self Care | Payer: BLUE CROSS/BLUE SHIELD | Source: Ambulatory Visit | Attending: Internal Medicine | Admitting: Internal Medicine

## 2015-03-09 DIAGNOSIS — K5090 Crohn's disease, unspecified, without complications: Secondary | ICD-10-CM | POA: Insufficient documentation

## 2015-03-09 DIAGNOSIS — K5790 Diverticulosis of intestine, part unspecified, without perforation or abscess without bleeding: Secondary | ICD-10-CM | POA: Diagnosis present

## 2015-03-09 DIAGNOSIS — K529 Noninfective gastroenteritis and colitis, unspecified: Secondary | ICD-10-CM | POA: Insufficient documentation

## 2015-03-09 MED ORDER — ACETAMINOPHEN 325 MG PO TABS
650.0000 mg | ORAL_TABLET | Freq: Once | ORAL | Status: AC
  Administered 2015-03-09: 650 mg via ORAL
  Filled 2015-03-09: qty 2

## 2015-03-09 MED ORDER — SODIUM CHLORIDE 0.9 % IV SOLN
400.0000 mg | Freq: Once | INTRAVENOUS | Status: AC
  Administered 2015-03-09: 400 mg via INTRAVENOUS
  Filled 2015-03-09: qty 40

## 2015-03-09 MED ORDER — LORATADINE 10 MG PO TABS
10.0000 mg | ORAL_TABLET | Freq: Once | ORAL | Status: AC
  Administered 2015-03-09: 10 mg via ORAL
  Filled 2015-03-09: qty 1

## 2015-03-09 MED ORDER — SODIUM CHLORIDE 0.9 % IV SOLN
Freq: Once | INTRAVENOUS | Status: AC
Start: 2015-03-09 — End: 2015-03-09
  Administered 2015-03-09: 250 mL via INTRAVENOUS

## 2015-03-09 NOTE — Discharge Instructions (Signed)
Infliximab injection What is this medicine? INFLIXIMAB (in [place] i mab) is used to treat Crohn's disease and ulcerative colitis. It is also used to treat ankylosing spondylitis, psoriasis, and some forms of arthritis. This medicine may be used for other purposes; ask your health care provider or pharmacist if you have questions. What should I tell my health care provider before I take this medicine? They need to know if you have any of these conditions: -diabetes -exposure to tuberculosis -heart failure -hepatitis or liver disease -immune system problems -infection -lung or breathing disease, like COPD -multiple sclerosis -current or past resident of [place] or [place] -seizure disorder -an unusual or allergic reaction to infliximab, mouse proteins, other medicines, foods, dyes, or preservatives -pregnant or trying to get pregnant -breast-feeding How should I use this medicine? This medicine is for injection into a vein. It is usually given by a health care professional in a hospital or clinic setting. A special MedGuide will be given to you by the pharmacist with each prescription and refill. Be sure to read this information carefully each time. Talk to your pediatrician regarding the use of this medicine in children. Special care may be needed. Overdosage: If you think you have taken too much of this medicine contact a poison control center or emergency room at once. NOTE: This medicine is only for you. Do not share this medicine with others. What if I miss a dose? It is important not to miss your dose. Call your doctor or health care professional if you are unable to keep an appointment. What may interact with this medicine? Do not take this medicine with any of the following medications: -anakinra -rilonacept This medicine may also interact with the following medications: -vaccines This list may not describe all possible interactions. Give your health care provider  a list of all the medicines, herbs, non-prescription drugs, or dietary supplements you use. Also tell them if you smoke, drink alcohol, or use illegal drugs. Some items may interact with your medicine. What should I watch for while using this medicine? Visit your doctor or health care professional for regular checks on your progress. If you get a cold or other infection while receiving this medicine, call your doctor or health care professional. Do not treat yourself. This medicine may decrease your body's ability to fight infections. Before beginning therapy, your doctor may do a test to see if you have been exposed to tuberculosis. This medicine may make the symptoms of heart failure worse in some patients. If you notice symptoms such as increased shortness of breath or swelling of the ankles or legs, contact your health care provider right away. If you are going to have surgery or dental work, tell your health care professional or dentist that you have received this medicine. If you take this medicine for plaque psoriasis, stay out of the sun. If you cannot avoid being in the sun, wear protective clothing and use sunscreen. Do not use sun lamps or tanning beds/booths. What side effects may I notice from receiving this medicine? Side effects that you should report to your doctor or health care professional as soon as possible: -allergic reactions like skin rash, itching or hives, swelling of the face, lips, or tongue -chest pain -fever or chills, usually related to the infusion -muscle or joint pain -red, scaly patches or raised bumps on the skin -signs of infection - fever or chills, cough, sore throat, pain or difficulty passing urine -swollen lymph nodes in the neck, underarm,  or groin areas -unexplained weight loss -unusual bleeding or bruising -unusually weak or tired -yellowing of the eyes or skin Side effects that usually do not require medical attention (report to your doctor or health  care professional if they continue or are bothersome): -headache -heartburn or stomach pain -nausea, vomiting This list may not describe all possible side effects. Call your doctor for medical advice about side effects. You may report side effects to FDA at 1-800-FDA-1088. Where should I keep my medicine? This drug is given in a hospital or clinic and will not be stored at home. NOTE: This sheet is a summary. It may not cover all possible information. If you have questions about this medicine, talk to your doctor, pharmacist, or health care provider.    2016, Elsevier/Gold Standard. (2007-12-08 10:26:02)

## 2015-03-23 ENCOUNTER — Encounter (HOSPITAL_COMMUNITY): Payer: Self-pay

## 2015-03-23 ENCOUNTER — Encounter (HOSPITAL_COMMUNITY)
Admission: RE | Admit: 2015-03-23 | Discharge: 2015-03-23 | Disposition: A | Discharge status: Home / Self Care | Payer: BLUE CROSS/BLUE SHIELD | Source: Ambulatory Visit | Attending: Internal Medicine | Admitting: Internal Medicine

## 2015-03-23 DIAGNOSIS — K5090 Crohn's disease, unspecified, without complications: Secondary | ICD-10-CM | POA: Diagnosis not present

## 2015-03-23 MED ORDER — SODIUM CHLORIDE 0.9 % IV SOLN
INTRAVENOUS | Status: DC
  Administered 2015-03-23: 12:00:00 via INTRAVENOUS

## 2015-03-23 MED ORDER — SODIUM CHLORIDE 0.9 % IV SOLN
5.0000 mg/kg | Freq: Once | INTRAVENOUS | Status: AC
  Administered 2015-03-23: 400 mg via INTRAVENOUS
  Filled 2015-03-23: qty 40

## 2015-03-23 MED ORDER — ACETAMINOPHEN 325 MG PO TABS
650.0000 mg | ORAL_TABLET | Freq: Once | ORAL | Status: AC
  Administered 2015-03-23: 650 mg via ORAL

## 2015-03-23 MED ORDER — ACETAMINOPHEN 325 MG PO TABS
ORAL_TABLET | ORAL | Status: AC
  Filled 2015-03-23: qty 2

## 2015-03-23 MED ORDER — LORATADINE 10 MG PO TABS: 10.0000 mg | ORAL_TABLET | Freq: Every day | ORAL | Status: DC

## 2015-04-17 ENCOUNTER — Ambulatory Visit (INDEPENDENT_AMBULATORY_CARE_PROVIDER_SITE_OTHER): Payer: BLUE CROSS/BLUE SHIELD | Admitting: Internal Medicine

## 2015-04-17 ENCOUNTER — Encounter (INDEPENDENT_AMBULATORY_CARE_PROVIDER_SITE_OTHER): Payer: Self-pay | Admitting: Internal Medicine

## 2015-04-17 VITALS — BP 100/80 | HR 74 | Temp 97.6°F | Resp 18 | Ht 67.0 in | Wt 186.8 lb

## 2015-04-17 DIAGNOSIS — K50119 Crohn's disease of large intestine with unspecified complications: Secondary | ICD-10-CM

## 2015-04-17 NOTE — Progress Notes (Signed)
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  Subjective:  Patient is 59 year old Caucasian male who is in for scheduled visit. He was last seen about 2 months ago. It took forever for Remicade to be approved. He received first dose almost 6 weeks ago. He noted symptomatic improvement within the first week. After second dose abdominal pain and diarrhea has resolved completely. He is due for third dose this Friday. He has not having any side effects. He is having 1-2 formed stools per day. He denies melena or rectal bleeding. He has very good appetite. He is wondering if he can get flu shots.    Current Medications: Outpatient Encounter Prescriptions as of 04/17/2015  Medication Sig  . Acetaminophen (TYLENOL PO) Take 375 mg by mouth. Patient will take 2-3 daily when needed only  . InFLIXimab (REMICADE IV) Inject 100 mg into the vein. Patient last infusion was 03/23/15. Next one due 04/20/15.  [name] losartan (COZAAR) 50 MG tablet Take 50 mg by mouth daily.  [name] omeprazole (PRILOSEC) 20 MG capsule Take 20 mg by mouth daily.  . Probiotic Product (PROBIOTIC PO) Take by mouth daily. Gummy form  . Wheat Dextrin (BENEFIBER DRINK MIX) PACK Take 4 g by mouth at bedtime.  . [DISCONTINUED] HYDROcodone-acetaminophen (NORCO/VICODIN) 5-325 MG tablet Take 1 tablet by mouth 3 (three) times daily before meals. (Patient not taking: Reported on 04/17/2015)  . [DISCONTINUED] polyethylene glycol (MIRALAX / GLYCOLAX) packet Take 17 g by mouth daily.   No facility-administered encounter medications on file as of 04/17/2015.     Objective: Blood pressure 100/80, pulse 74, temperature 97.6 F (36.4 C), temperature source Oral, resp. rate 18, height 5' 7"  (1.702 m), weight 186 lb 12.8 oz (84.732 kg). Patient is alert and in no acute distress. Conjunctiva is pink. Sclera is nonicteric Oropharyngeal mucosa is normal. No neck masses or thyromegaly noted. Cardiac exam with  regular rhythm normal S1 and S2. No murmur or gallop noted. Lungs are clear to auscultation. Abdomen is full but soft and nontender without organomegaly or masses. No LE edema or clubbing noted.   Assessment:  #1. Segmental colitis associated with colonic diverticulosis. He has received two doses of Remicade infusion with complete resolution of his symptoms. He will finish induction therapy(3 doses) this Friday and thereafter he will go on maintenance dose every 8 weeks.   Plan:  Patient will continue Remicade infusion every 8 weeks after his third dose later this week. Patient will call if he has any side effects or symptoms relapse. Office visit in 6 months.

## 2015-04-17 NOTE — Patient Instructions (Signed)
Call if you experience side effects or symptoms relapse.

## 2015-04-20 ENCOUNTER — Encounter (HOSPITAL_COMMUNITY)
Admission: RE | Admit: 2015-04-20 | Discharge: 2015-04-20 | Disposition: A | Discharge status: Home / Self Care | Payer: BLUE CROSS/BLUE SHIELD | Source: Ambulatory Visit | Attending: Internal Medicine | Admitting: Internal Medicine

## 2015-04-20 DIAGNOSIS — Z882 Allergy status to sulfonamides status: Secondary | ICD-10-CM | POA: Insufficient documentation

## 2015-04-20 DIAGNOSIS — K5090 Crohn's disease, unspecified, without complications: Secondary | ICD-10-CM | POA: Diagnosis present

## 2015-04-20 DIAGNOSIS — K5790 Diverticulosis of intestine, part unspecified, without perforation or abscess without bleeding: Secondary | ICD-10-CM | POA: Insufficient documentation

## 2015-04-20 MED ORDER — ACETAMINOPHEN 325 MG PO TABS
ORAL_TABLET | ORAL | Status: AC
  Filled 2015-04-20: qty 2

## 2015-04-20 MED ORDER — SODIUM CHLORIDE 0.9 % IV SOLN
Freq: Once | INTRAVENOUS | Status: AC
  Administered 2015-04-20: 200 mL via INTRAVENOUS

## 2015-04-20 MED ORDER — SODIUM CHLORIDE 0.9 % IV SOLN
5.0000 mg/kg | Freq: Once | INTRAVENOUS | Status: AC
  Administered 2015-04-20: 400 mg via INTRAVENOUS
  Filled 2015-04-20: qty 40

## 2015-04-20 MED ORDER — ACETAMINOPHEN 325 MG PO TABS
650.0000 mg | ORAL_TABLET | Freq: Once | ORAL | Status: AC
  Administered 2015-04-20: 650 mg via ORAL

## 2015-04-20 NOTE — Progress Notes (Signed)
Arrived for scheduled dose remicade. States he took claritin at home as premed.

## 2015-05-04 ENCOUNTER — Inpatient Hospital Stay (HOSPITAL_COMMUNITY): Admission: RE | Admit: 2015-05-04 | Payer: BLUE CROSS/BLUE SHIELD | Source: Ambulatory Visit

## 2015-05-08 ENCOUNTER — Other Ambulatory Visit (INDEPENDENT_AMBULATORY_CARE_PROVIDER_SITE_OTHER): Payer: Self-pay | Admitting: *Deleted

## 2015-05-08 ENCOUNTER — Telehealth (INDEPENDENT_AMBULATORY_CARE_PROVIDER_SITE_OTHER): Payer: Self-pay | Admitting: Internal Medicine

## 2015-05-08 MED ORDER — OMEPRAZOLE 20 MG PO CPDR: 20.0000 mg | DELAYED_RELEASE_CAPSULE | Freq: Every day | ORAL | Status: DC

## 2015-05-08 NOTE — Telephone Encounter (Signed)
May approve per NUR.

## 2015-05-08 NOTE — Telephone Encounter (Signed)
A prescription has been sent into patient's pharmacy. A message was left on his voicemail.

## 2015-05-08 NOTE — Telephone Encounter (Signed)
Nicholas Ellison called saying he needs a refill sent to [facility] in [place] for Omeprazole. He's confused as to why the prior Rx was for 30 days instead of longer. Please give him a call if needed.  Pt's ph# [phone] Thank you.

## 2015-05-08 NOTE — Telephone Encounter (Signed)
A refill sent to the pharmacy.

## 2015-06-15 ENCOUNTER — Encounter (HOSPITAL_COMMUNITY)
Admission: RE | Admit: 2015-06-15 | Discharge: 2015-06-15 | Disposition: A | Discharge status: Home / Self Care | Payer: BLUE CROSS/BLUE SHIELD | Source: Ambulatory Visit | Attending: Internal Medicine | Admitting: Internal Medicine

## 2015-06-15 ENCOUNTER — Encounter (HOSPITAL_COMMUNITY): Payer: Self-pay

## 2015-06-15 DIAGNOSIS — K50118 Crohn's disease of large intestine with other complication: Secondary | ICD-10-CM | POA: Insufficient documentation

## 2015-06-15 MED ORDER — LORATADINE 10 MG PO TABS
ORAL_TABLET | ORAL | Status: AC
  Filled 2015-06-15: qty 1

## 2015-06-15 MED ORDER — ACETAMINOPHEN 325 MG PO TABS
ORAL_TABLET | ORAL | Status: AC
  Filled 2015-06-15: qty 2

## 2015-06-15 MED ORDER — LORATADINE 10 MG PO TABS
10.0000 mg | ORAL_TABLET | Freq: Once | ORAL | Status: AC
  Administered 2015-06-15: 10 mg via ORAL

## 2015-06-15 MED ORDER — SODIUM CHLORIDE 0.9 % IV SOLN
400.0000 mg | Freq: Once | INTRAVENOUS | Status: AC
  Administered 2015-06-15: 400 mg via INTRAVENOUS
  Filled 2015-06-15: qty 40

## 2015-06-15 MED ORDER — ACETAMINOPHEN 325 MG PO TABS
650.0000 mg | ORAL_TABLET | Freq: Once | ORAL | Status: AC
  Administered 2015-06-15: 650 mg via ORAL

## 2015-06-15 MED ORDER — SODIUM CHLORIDE 0.9 % IV SOLN
Freq: Once | INTRAVENOUS | Status: AC
  Administered 2015-06-15: 250 mL via INTRAVENOUS

## 2015-08-10 ENCOUNTER — Telehealth (INDEPENDENT_AMBULATORY_CARE_PROVIDER_SITE_OTHER): Payer: Self-pay | Admitting: *Deleted

## 2015-08-10 ENCOUNTER — Encounter (HOSPITAL_COMMUNITY)
Admission: RE | Admit: 2015-08-10 | Discharge: 2015-08-10 | Disposition: A | Discharge status: Home / Self Care | Payer: BLUE CROSS/BLUE SHIELD | Source: Ambulatory Visit | Attending: Internal Medicine | Admitting: Internal Medicine

## 2015-08-10 DIAGNOSIS — K5010 Crohn's disease of large intestine without complications: Secondary | ICD-10-CM | POA: Insufficient documentation

## 2015-08-10 DIAGNOSIS — K50118 Crohn's disease of large intestine with other complication: Secondary | ICD-10-CM

## 2015-08-10 MED ORDER — ACETAMINOPHEN 325 MG PO TABS: 650.0000 mg | ORAL_TABLET | Freq: Once | ORAL | Status: DC

## 2015-08-10 MED ORDER — SODIUM CHLORIDE 0.9 % IV SOLN
INTRAVENOUS | Status: DC
  Administered 2015-08-10: 10 mL/h via INTRAVENOUS

## 2015-08-10 MED ORDER — LORATADINE 10 MG PO TABS: 10.0000 mg | ORAL_TABLET | Freq: Once | ORAL | Status: DC

## 2015-08-10 MED ORDER — SODIUM CHLORIDE 0.9 % IV SOLN
5.0000 mg/kg | INTRAVENOUS | Status: DC
  Administered 2015-08-10: 400 mg via INTRAVENOUS
  Filled 2015-08-10: qty 40

## 2015-08-10 NOTE — Telephone Encounter (Signed)
Per Celene in short stay at [facility], it is their protocol that the patient have this lab drawn.

## 2015-10-05 ENCOUNTER — Encounter (HOSPITAL_COMMUNITY)
Admission: RE | Admit: 2015-10-05 | Discharge: 2015-10-05 | Disposition: A | Discharge status: Home / Self Care | Payer: BLUE CROSS/BLUE SHIELD | Source: Ambulatory Visit | Attending: Internal Medicine | Admitting: Internal Medicine

## 2015-10-05 DIAGNOSIS — K5010 Crohn's disease of large intestine without complications: Secondary | ICD-10-CM | POA: Diagnosis not present

## 2015-10-05 MED ORDER — SODIUM CHLORIDE 0.9 % IV SOLN
INTRAVENOUS | Status: DC
  Administered 2015-10-05: 250 mL via INTRAVENOUS

## 2015-10-05 MED ORDER — SODIUM CHLORIDE 0.9 % IV SOLN
5.0000 mg/kg | INTRAVENOUS | Status: DC
  Administered 2015-10-05: 400 mg via INTRAVENOUS
  Filled 2015-10-05: qty 40

## 2015-10-05 NOTE — Progress Notes (Signed)
Refuses tylenol and claritin at this time.

## 2015-10-23 ENCOUNTER — Encounter (INDEPENDENT_AMBULATORY_CARE_PROVIDER_SITE_OTHER): Payer: Self-pay | Admitting: Internal Medicine

## 2015-10-23 ENCOUNTER — Ambulatory Visit (INDEPENDENT_AMBULATORY_CARE_PROVIDER_SITE_OTHER): Payer: BLUE CROSS/BLUE SHIELD | Admitting: Internal Medicine

## 2015-10-23 VITALS — BP 130/80 | HR 72 | Temp 97.7°F | Resp 18 | Ht 67.0 in | Wt 190.6 lb

## 2015-10-23 DIAGNOSIS — K5010 Crohn's disease of large intestine without complications: Secondary | ICD-10-CM

## 2015-10-23 NOTE — Progress Notes (Signed)
Presenting complaint;  Follow-up for segmental sigmoid colitis(SCAD).  Database and Subjective:  Nicholas Ellison 60 year old Caucasian male who has segmental colitis associated with diverticulosis and is here for scheduled visit. He was last seen in December 2016. He remains on infliximab infusion every 8 weeks. He feels fine. He is not having any side effects with infliximab. He did have an episode of diarrhea 2 months ago when he ate at [employer]. He had watery diarrhea but no fever or rectal bleeding. He denies abdominal pain. Bowels move daily. He has good appetite and he has gained 4 pounds since his last visit.   Current Medications: Outpatient Encounter Prescriptions as of 10/23/2015  Medication Sig  . Acetaminophen (TYLENOL PO) Take 375 mg by mouth. Patient will take 2-3 daily when needed only  . InFLIXimab (REMICADE IV) Inject 100 mg into the vein. Patient last infusion was 03/23/15. Next one due 04/20/15.  [name] losartan (COZAAR) 50 MG tablet Take 50 mg by mouth daily.  [name] omeprazole (PRILOSEC) 20 MG capsule Take 1 capsule (20 mg total) by mouth daily.  . Probiotic Product (PROBIOTIC PO) Take by mouth daily. Gummy form  . Wheat Dextrin (BENEFIBER DRINK MIX) PACK Take 4 g by mouth at bedtime.   No facility-administered encounter medications on file as of 10/23/2015.     Objective: Blood pressure 130/80, pulse 72, temperature 97.7 F (36.5 C), temperature source Oral, resp. rate 18, height 5' 7"  (1.702 m), weight 190 lb 9.6 oz (86.456 kg). Patient is alert and in no acute distress. Conjunctiva is pink. Sclera is nonicteric Oropharyngeal mucosa is normal. No neck masses or thyromegaly noted. Cardiac exam with regular rhythm normal S1 and S2. No murmur or gallop noted. Lungs are clear to auscultation. Abdomen is full but soft and nontender without organomegaly or masses.  No LE edema or clubbing noted.    Assessment:  #1. Segmental colitis associated with diverticulosis. He is  doing well with infliximab. He is in remission. He will continue therapy as long as working and he has no side effects. Recent bout of nonbloody diarrhea appears to be foodborne illness.   Plan:  Continue infliximab infusion every 8 weeks. Office visit in 6 months unless he has problems.

## 2015-10-23 NOTE — Patient Instructions (Signed)
Call if you have abdominal pain or rectal bleeding

## 2015-11-02 DIAGNOSIS — I10 Essential (primary) hypertension: Secondary | ICD-10-CM | POA: Diagnosis not present

## 2015-11-02 DIAGNOSIS — K210 Gastro-esophageal reflux disease with esophagitis: Secondary | ICD-10-CM | POA: Diagnosis not present

## 2015-11-02 DIAGNOSIS — R5382 Chronic fatigue, unspecified: Secondary | ICD-10-CM | POA: Diagnosis not present

## 2015-11-15 DIAGNOSIS — G5792 Unspecified mononeuropathy of left lower limb: Secondary | ICD-10-CM | POA: Diagnosis not present

## 2015-11-15 DIAGNOSIS — K210 Gastro-esophageal reflux disease with esophagitis: Secondary | ICD-10-CM | POA: Diagnosis not present

## 2015-11-15 DIAGNOSIS — B356 Tinea cruris: Secondary | ICD-10-CM | POA: Diagnosis not present

## 2015-11-15 DIAGNOSIS — I10 Essential (primary) hypertension: Secondary | ICD-10-CM | POA: Diagnosis not present

## 2015-11-15 DIAGNOSIS — Z1389 Encounter for screening for other disorder: Secondary | ICD-10-CM | POA: Diagnosis not present

## 2015-11-19 DIAGNOSIS — G5792 Unspecified mononeuropathy of left lower limb: Secondary | ICD-10-CM | POA: Diagnosis not present

## 2015-11-19 DIAGNOSIS — R200 Anesthesia of skin: Secondary | ICD-10-CM | POA: Diagnosis not present

## 2015-11-30 ENCOUNTER — Encounter (HOSPITAL_COMMUNITY)
Admission: RE | Admit: 2015-11-30 | Discharge: 2015-11-30 | Disposition: A | Discharge status: Home / Self Care | Payer: BLUE CROSS/BLUE SHIELD | Source: Ambulatory Visit | Attending: Internal Medicine | Admitting: Internal Medicine

## 2015-11-30 DIAGNOSIS — K50118 Crohn's disease of large intestine with other complication: Secondary | ICD-10-CM | POA: Diagnosis not present

## 2015-11-30 DIAGNOSIS — K5790 Diverticulosis of intestine, part unspecified, without perforation or abscess without bleeding: Secondary | ICD-10-CM | POA: Diagnosis not present

## 2015-11-30 MED ORDER — ACETAMINOPHEN 325 MG PO TABS
650.0000 mg | ORAL_TABLET | Freq: Once | ORAL | Status: AC
  Administered 2015-11-30: 650 mg via ORAL

## 2015-11-30 MED ORDER — ACETAMINOPHEN 325 MG PO TABS
ORAL_TABLET | ORAL | Status: AC
  Filled 2015-11-30: qty 2

## 2015-11-30 MED ORDER — LORATADINE 10 MG PO TABS
10.0000 mg | ORAL_TABLET | Freq: Every day | ORAL | Status: DC
  Administered 2015-11-30: 10 mg via ORAL

## 2015-11-30 MED ORDER — LORATADINE 10 MG PO TABS
ORAL_TABLET | ORAL | Status: AC
  Filled 2015-11-30: qty 1

## 2015-11-30 MED ORDER — SODIUM CHLORIDE 0.9 % IV SOLN
Freq: Once | INTRAVENOUS | Status: AC
  Administered 2015-11-30: 200 mL via INTRAVENOUS

## 2015-11-30 MED ORDER — SODIUM CHLORIDE 0.9 % IV SOLN
5.0000 mg/kg | Freq: Once | INTRAVENOUS | Status: AC
  Administered 2015-11-30: 400 mg via INTRAVENOUS
  Filled 2015-11-30: qty 40

## 2015-12-28 ENCOUNTER — Encounter (HOSPITAL_COMMUNITY): Payer: BLUE CROSS/BLUE SHIELD

## 2016-01-06 IMAGING — CT CT ABD-PELV W/ CM
2 of 5 series · 14 of 46 positions shown, 16 images · IV contrast (Omnipaque 300)
Comparison: CT of the abdomen and pelvis 09/07/2013.

CLINICAL DATA: 59-year-old male presenting with complaint of
bilateral groin pain. History of diverticulitis.

EXAM:
CT ABDOMEN AND PELVIS WITH CONTRAST
TECHNIQUE: Multidetector CT imaging of the abdomen and pelvis was performed
using the standard protocol following bolus administration of
intravenous contrast.
CONTRAST:  100mL OMNIPAQUE IOHEXOL 300 MG/ML  SOLN

[Series 2: abd_pel_with 5.0 b40f · axial · 0.83mm/px · z∈[-468,-68]mm · 11 of 92 slices shown, 13 images]
[im 6/92  soft-tissue]
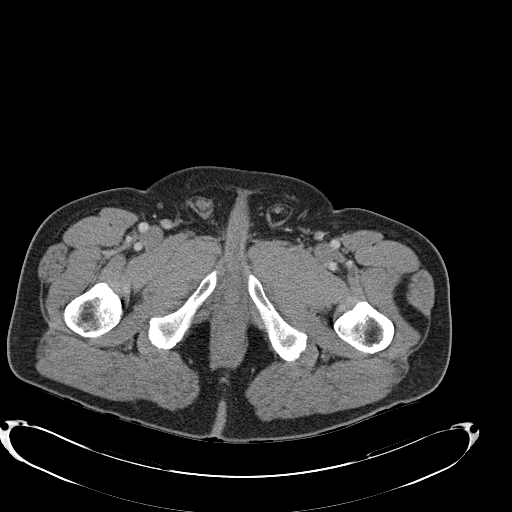
[im 6/92  bone]
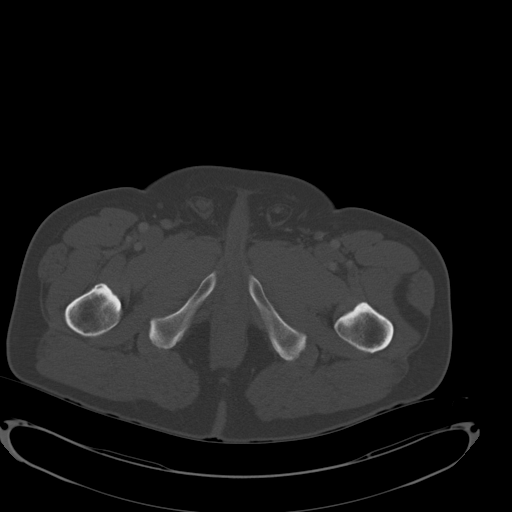
[im 18/92  soft-tissue]
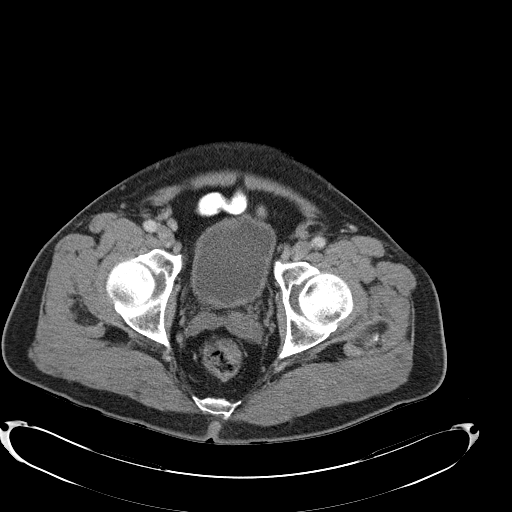
[im 23/92  soft-tissue]
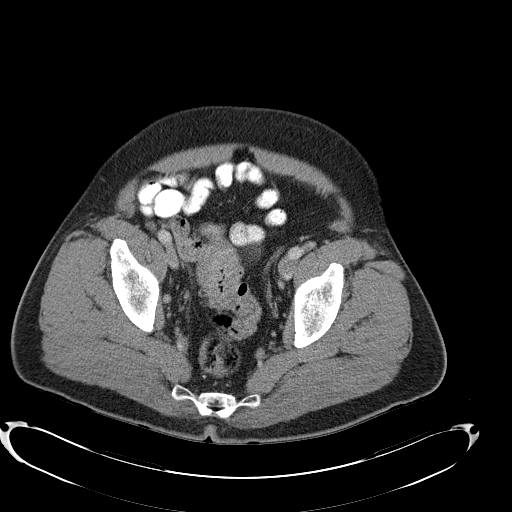
[im 29/92  soft-tissue]
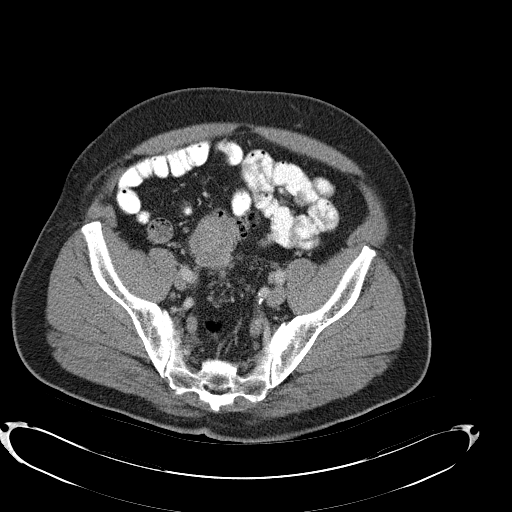
[im 40/92  soft-tissue]
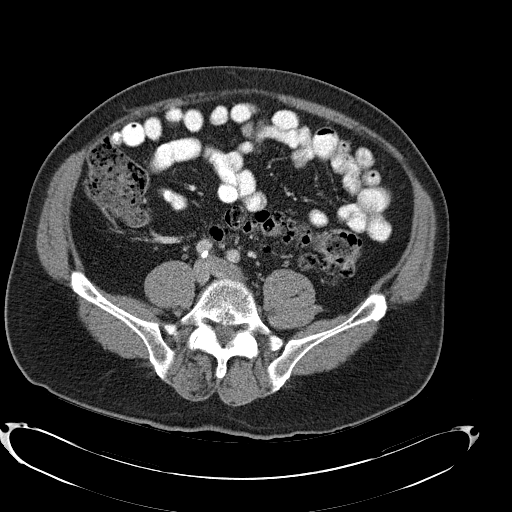
[im 46/92  soft-tissue]
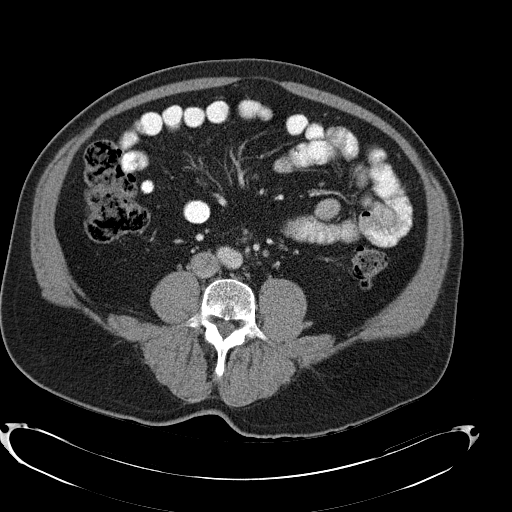
[im 52/92  soft-tissue]
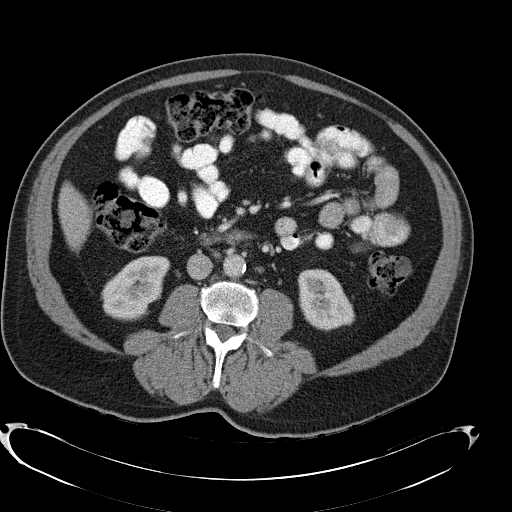
[im 63/92  soft-tissue]
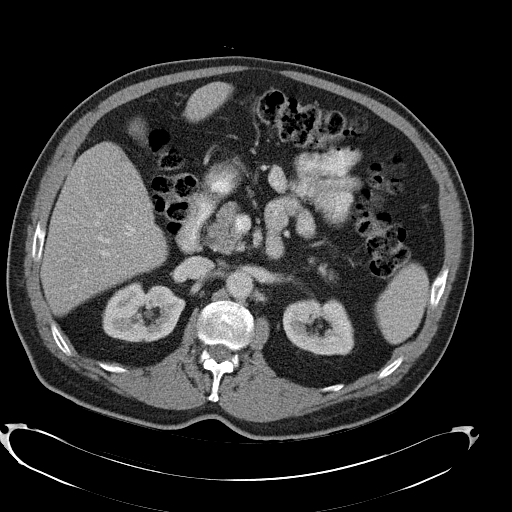
[im 69/92  soft-tissue]
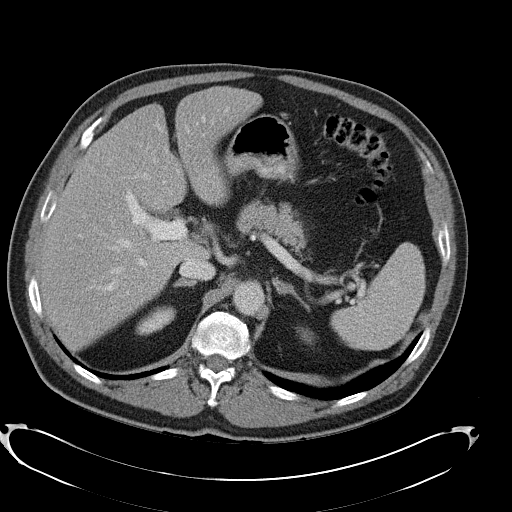
[im 69/92  bone]
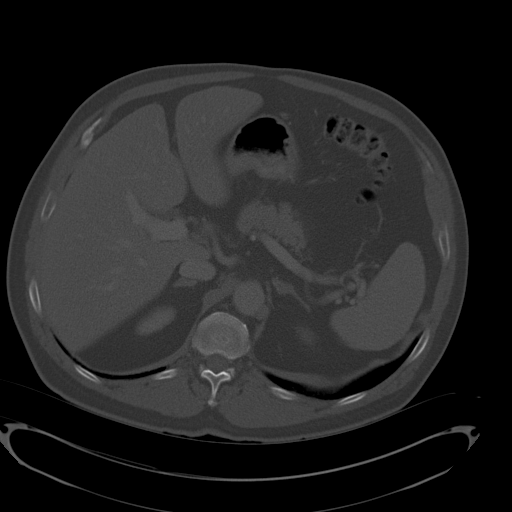
[im 74/92  soft-tissue]
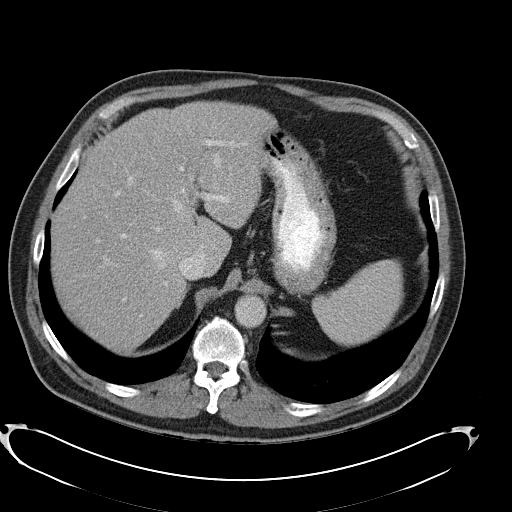
[im 86/92  soft-tissue]
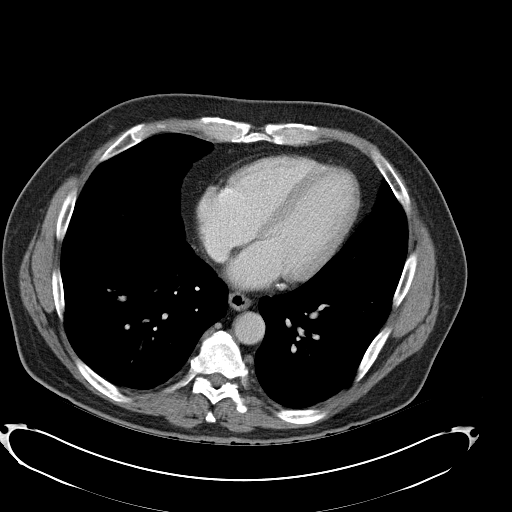

[Series 3: abd_pel_with 3.0 spo · coronal · 0.79mm/px · 3 of 112 slices shown]
[im 38/112  soft-tissue]
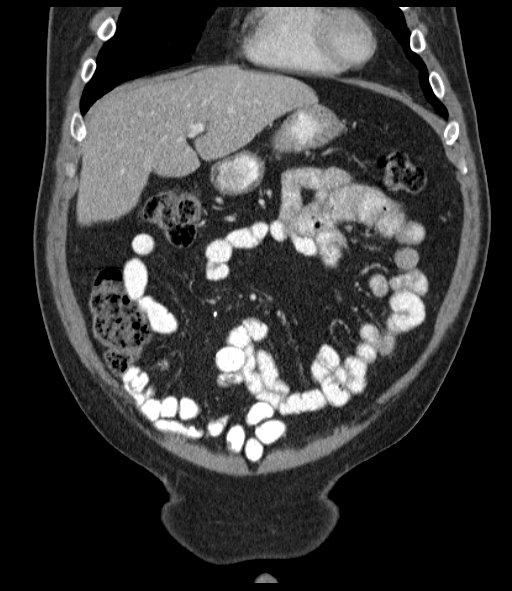
[im 50/112  soft-tissue]
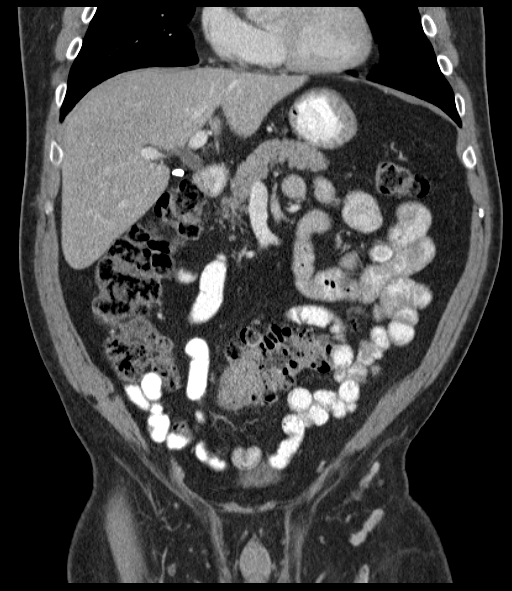
[im 62/112  soft-tissue]
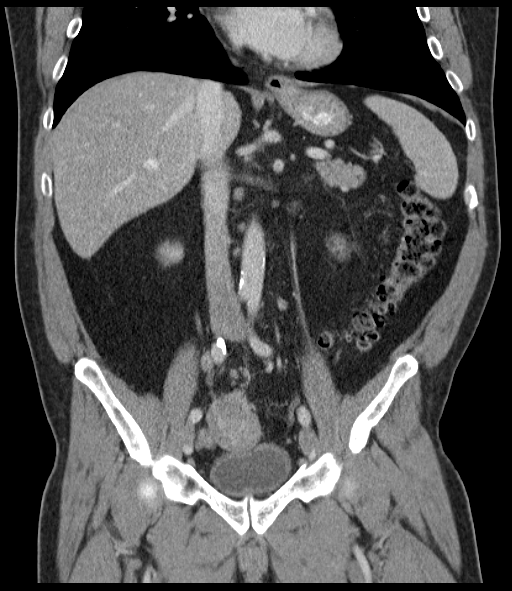

[14 of 46 positions shown; findings below may reference images not displayed]

FINDINGS: Lower chest: Small hiatal hernia. Atherosclerotic calcification in
the left circumflex and right coronary arteries. Prominent but
nonenlarged retrocrural lymph nodes are conspicuous by their number
rather than their size.

Hepatobiliary: Diffuse low attenuation throughout the hepatic
parenchyma, compatible with mild hepatic steatosis. Status post
cholecystectomy. No cystic or solid hepatic lesions. No intra or
extrahepatic biliary ductal dilatation.

Pancreas: Unremarkable.

Spleen: Unremarkable.

Adrenals/Urinary Tract: Bilateral adrenal glands and bilateral
kidneys are normal in appearance. No hydroureteronephrosis. Urinary
bladder is normal in appearance.

Stomach/Bowel: As with the prior examination from 09/08/2011, there
continues to be an area of mass-like thickening in the distal
sigmoid colon, however, this is significantly larger and more
mass-like than the prior examination. This region currently is spans
a length of approximately 9.3 cm in the distal sigmoid colon,
measuring up to 3.9 x 4.3 cm, throughout which there is extensive
mass-like thickening of the colonic wall. The adjacent portions of
the colon are heavily diseased with diverticulae, however, this area
of colonic thickening is distinctly out of proportion to the
adjacent portions of relatively normal appearing sigmoid colon and
rectum. Multiple borderline enlarged mesocolic and mesorectal lymph
nodes are noted, measuring up to 11 x 8 mm (image 60 of series 2).
The appearance of the stomach is normal. No pathologic dilatation of
small bowel or colon.

Vascular/Lymphatic: As mentioned above, multiple borderline enlarged
mesorectal and mesocolic lymph nodes are noted. There are multiple
prominent borderline enlarged retroperitoneal lymph nodes as well,
measuring up to 9 mm in short axis (image 44 of series 2).
Borderline enlarged bilateral inguinal lymph nodes are also noted,
measuring up to 9 mm on the right side. Borderline enlarged and
minimally enlarged right-sided pelvic lymph nodes are also noted,
largest of which is a right common iliac lymph node measuring 11 mm
in short axis (image 58 of series 2). Atherosclerosis throughout the
abdominal and pelvic vasculature, without evidence of aneurysm or
dissection.

Reproductive: Prostate gland and seminal vesicles are unremarkable
in appearance.

Other: No significant volume of ascites.  No pneumoperitoneum.

Musculoskeletal: There are no aggressive appearing lytic or blastic
lesions noted in the visualized portions of the skeleton.
IMPRESSION: 1. The appearance of the distal sigmoid colon is highly concerning
for colonic neoplasm, as detailed above, and there multiple
borderline enlarged and mildly enlarged lymph nodes in the
mesorectum, mesocolon, the pelvis and retroperitoneum, concerning
for potential metastatic spread. No additional sites of potential
metastatic disease are noted in the abdomen or pelvis. Correlation
with colonoscopy is strongly recommended. Additionally, further
evaluation with PET-CT should be considered in the near future for
further staging purposes.
2. Extensive colonic diverticulosis without findings to suggest
acute diverticulitis at this time.
3. Atherosclerosis, including at least 2 vessel coronary artery
disease. Please note that although the presence of coronary artery
calcium documents the presence of coronary artery disease, the
severity of this disease and any potential stenosis cannot be
assessed on this non-gated CT examination. Assessment for potential
risk factor modification, dietary therapy or pharmacologic therapy
may be warranted, if clinically indicated.
4. Mild hepatic steatosis.
5. Additional incidental findings, as above.
These results were called by telephone at the time of interpretation
on 08/10/2014 at [DATE] to Dr. BONILLA BENCOMO, who verbally
acknowledged these results.

## 2016-01-25 ENCOUNTER — Encounter (HOSPITAL_COMMUNITY)
Admission: RE | Admit: 2016-01-25 | Discharge: 2016-01-25 | Disposition: A | Discharge status: Home / Self Care | Payer: BLUE CROSS/BLUE SHIELD | Source: Ambulatory Visit | Attending: Internal Medicine | Admitting: Internal Medicine

## 2016-01-25 DIAGNOSIS — K5792 Diverticulitis of intestine, part unspecified, without perforation or abscess without bleeding: Secondary | ICD-10-CM | POA: Insufficient documentation

## 2016-01-25 DIAGNOSIS — K5010 Crohn's disease of large intestine without complications: Secondary | ICD-10-CM | POA: Insufficient documentation

## 2016-01-25 MED ORDER — SODIUM CHLORIDE 0.9 % IV SOLN
INTRAVENOUS | Status: DC
  Administered 2016-01-25: 09:00:00 via INTRAVENOUS

## 2016-01-25 MED ORDER — INFLIXIMAB 100 MG IV SOLR
5.0000 mg/kg | Freq: Once | INTRAVENOUS | Status: AC
  Administered 2016-01-25: 400 mg via INTRAVENOUS
  Filled 2016-01-25: qty 40

## 2016-01-25 MED ORDER — ACETAMINOPHEN 325 MG PO TABS
650.0000 mg | ORAL_TABLET | Freq: Once | ORAL | Status: AC
  Administered 2016-01-25: 650 mg via ORAL
  Filled 2016-01-25: qty 2

## 2016-01-25 MED ORDER — LORATADINE 10 MG PO TABS
10.0000 mg | ORAL_TABLET | Freq: Once | ORAL | Status: AC
  Administered 2016-01-25: 10 mg via ORAL
  Filled 2016-01-25: qty 1

## 2016-03-21 ENCOUNTER — Encounter (HOSPITAL_COMMUNITY)
Admission: RE | Admit: 2016-03-21 | Discharge: 2016-03-21 | Disposition: A | Discharge status: Home / Self Care | Payer: BLUE CROSS/BLUE SHIELD | Source: Ambulatory Visit | Attending: Internal Medicine | Admitting: Internal Medicine

## 2016-03-21 DIAGNOSIS — K5010 Crohn's disease of large intestine without complications: Secondary | ICD-10-CM | POA: Insufficient documentation

## 2016-03-21 MED ORDER — SODIUM CHLORIDE 0.9 % IV SOLN
5.0000 mg/kg | INTRAVENOUS | Status: DC
  Administered 2016-03-21: 400 mg via INTRAVENOUS
  Filled 2016-03-21: qty 40

## 2016-03-21 MED ORDER — SODIUM CHLORIDE 0.9 % IV SOLN
INTRAVENOUS | Status: DC
  Administered 2016-03-21: 250 mL via INTRAVENOUS

## 2016-04-16 ENCOUNTER — Other Ambulatory Visit (INDEPENDENT_AMBULATORY_CARE_PROVIDER_SITE_OTHER): Payer: Self-pay | Admitting: *Deleted

## 2016-04-16 ENCOUNTER — Telehealth (INDEPENDENT_AMBULATORY_CARE_PROVIDER_SITE_OTHER): Payer: Self-pay | Admitting: Internal Medicine

## 2016-04-16 MED ORDER — OMEPRAZOLE 20 MG PO CPDR: 20.0000 mg | DELAYED_RELEASE_CAPSULE | Freq: Every day | ORAL | 5 refills | Status: DC

## 2016-04-16 NOTE — Telephone Encounter (Signed)
A refill was sent to the patient's pharmacy.

## 2016-04-16 NOTE — Telephone Encounter (Signed)
Patient called, stated that he went to the pharmacy to get his Omeprazole 2m refilled.  Per the pharmacy, the script has expired.  He'd like to get a refill.  [phone]

## 2016-04-22 ENCOUNTER — Ambulatory Visit (INDEPENDENT_AMBULATORY_CARE_PROVIDER_SITE_OTHER): Payer: BLUE CROSS/BLUE SHIELD | Admitting: Internal Medicine

## 2016-04-22 ENCOUNTER — Encounter (INDEPENDENT_AMBULATORY_CARE_PROVIDER_SITE_OTHER): Payer: Self-pay | Admitting: Internal Medicine

## 2016-04-22 ENCOUNTER — Encounter (INDEPENDENT_AMBULATORY_CARE_PROVIDER_SITE_OTHER): Payer: Self-pay

## 2016-04-22 VITALS — BP 100/64 | HR 80 | Temp 97.7°F | Ht 67.0 in | Wt 191.7 lb

## 2016-04-22 DIAGNOSIS — K5010 Crohn's disease of large intestine without complications: Secondary | ICD-10-CM | POA: Diagnosis not present

## 2016-04-22 DIAGNOSIS — R21 Rash and other nonspecific skin eruption: Secondary | ICD-10-CM | POA: Diagnosis not present

## 2016-04-22 MED ORDER — NYSTATIN-TRIAMCINOLONE 100000-0.1 UNIT/GM-% EX CREA: 1.0000 "application " | TOPICAL_CREAM | Freq: Two times a day (BID) | CUTANEOUS | 1 refills | Status: DC

## 2016-04-22 NOTE — Patient Instructions (Addendum)
I Mycolog-II cream to area was skin rash twice daily until healed and thereafter as needed.

## 2016-04-22 NOTE — Progress Notes (Signed)
Presenting complaint;  Follow-up for segmental colitis(SCAD).  Subjective:  Nicholas Ellison is 60 year old Caucasian male who was segmental sigmoid colitis and is here for scheduled visit. He was last seen 6 months ago. He remains on infliximab infusion every 8 weeks. He has occasional diarrhea. On most days he has one to 2 formed stools. He denies melena or rectal bleeding. He does not believe he is having any side effects on infliximab. He does complain of pruritic rash in both inguinal areas. He was given medication by Dr. Quintin Alto rash did not go away completely. He states he had complete blood count by Dr. Quintin Alto earlier this year.  Current Medications: Outpatient Encounter Prescriptions as of 04/22/2016  Medication Sig  . Acetaminophen (TYLENOL PO) Take 375 mg by mouth. Patient will take 2-3 daily when needed only  . InFLIXimab (REMICADE IV) Inject 100 mg into the vein. Patient last infusion was 03/23/15. Next one due 04/20/15.  [name] loratadine (CLARITIN) 10 MG tablet Take 10 mg by mouth daily as needed.   [name] losartan (COZAAR) 50 MG tablet Take 50 mg by mouth daily.  . Probiotic Product (PROBIOTIC PO) Take by mouth daily. Gummy form  . Wheat Dextrin (BENEFIBER DRINK MIX) PACK Take 4 g by mouth at bedtime. (Patient taking differently: Take 4 g by mouth at bedtime. Every 2 days)  . omeprazole (PRILOSEC) 20 MG capsule Take 1 capsule (20 mg total) by mouth daily.   No facility-administered encounter medications on file as of 04/22/2016.      Objective: Blood pressure 100/64, pulse 80, temperature 97.7 F (36.5 C), height 5' 7"  (1.702 m), weight 191 lb 11.2 oz (87 kg). Patient is alert and in no acute distress. Conjunctiva is pink. Sclera is nonicteric Oropharyngeal mucosa is normal. No neck masses or thyromegaly noted. Cardiac exam with regular rhythm normal S1 and S2. No murmur or gallop noted. Lungs are clear to auscultation. Abdomen is full but soft and nontender without organomegaly or  masses. He has erythematous macular rash involving both inguinal areas.  No LE edema or clubbing noted.  Labs/studies Results:    Assessment:  #1. Segmental sigmoid colitis. Patient remains in remission on infliximab. He will continue his therapy as long as it is working and he's not having any side effects. #2. Bilateral inguinal skin rash appears to be fungal. . Plan:  Will request copy of blood work from Dr. Edythe Lynn for review. Mycolog-II cream to be applied to area with skin rash twice a day until this completely resolved. Patient advised to use cotton underpants and keep this area dry. Office visit in 6 months.

## 2016-05-13 ENCOUNTER — Encounter (INDEPENDENT_AMBULATORY_CARE_PROVIDER_SITE_OTHER): Payer: Self-pay

## 2016-05-16 ENCOUNTER — Encounter (HOSPITAL_COMMUNITY)
Admission: RE | Admit: 2016-05-16 | Discharge: 2016-05-16 | Disposition: A | Discharge status: Home / Self Care | Payer: BLUE CROSS/BLUE SHIELD | Source: Ambulatory Visit | Attending: Internal Medicine | Admitting: Internal Medicine

## 2016-05-16 DIAGNOSIS — K5732 Diverticulitis of large intestine without perforation or abscess without bleeding: Secondary | ICD-10-CM | POA: Diagnosis not present

## 2016-05-16 DIAGNOSIS — K5090 Crohn's disease, unspecified, without complications: Secondary | ICD-10-CM | POA: Diagnosis not present

## 2016-05-16 MED ORDER — LORATADINE 10 MG PO TABS: 10.0000 mg | ORAL_TABLET | Freq: Once | ORAL | Status: DC

## 2016-05-16 MED ORDER — SODIUM CHLORIDE 0.9 % IV SOLN
INTRAVENOUS | Status: DC
  Administered 2016-05-16: 08:00:00 via INTRAVENOUS

## 2016-05-16 MED ORDER — ACETAMINOPHEN 325 MG PO TABS: 650.0000 mg | ORAL_TABLET | Freq: Once | ORAL | Status: DC

## 2016-05-16 MED ORDER — SODIUM CHLORIDE 0.9 % IV SOLN
5.0000 mg/kg | Freq: Once | INTRAVENOUS | Status: AC
  Administered 2016-05-16: 400 mg via INTRAVENOUS
  Filled 2016-05-16: qty 20

## 2016-05-16 NOTE — Progress Notes (Signed)
Pt states took claritin and tylenol at home before coming to hospital.

## 2016-05-19 DIAGNOSIS — R5382 Chronic fatigue, unspecified: Secondary | ICD-10-CM | POA: Diagnosis not present

## 2016-05-19 DIAGNOSIS — K210 Gastro-esophageal reflux disease with esophagitis: Secondary | ICD-10-CM | POA: Diagnosis not present

## 2016-05-19 DIAGNOSIS — I10 Essential (primary) hypertension: Secondary | ICD-10-CM | POA: Diagnosis not present

## 2016-05-23 DIAGNOSIS — Z23 Encounter for immunization: Secondary | ICD-10-CM | POA: Diagnosis not present

## 2016-05-23 DIAGNOSIS — I10 Essential (primary) hypertension: Secondary | ICD-10-CM | POA: Diagnosis not present

## 2016-05-23 DIAGNOSIS — G5792 Unspecified mononeuropathy of left lower limb: Secondary | ICD-10-CM | POA: Diagnosis not present

## 2016-05-23 DIAGNOSIS — K210 Gastro-esophageal reflux disease with esophagitis: Secondary | ICD-10-CM | POA: Diagnosis not present

## 2016-05-23 DIAGNOSIS — E782 Mixed hyperlipidemia: Secondary | ICD-10-CM | POA: Diagnosis not present

## 2016-06-06 DIAGNOSIS — M25512 Pain in left shoulder: Secondary | ICD-10-CM | POA: Diagnosis not present

## 2016-06-11 DIAGNOSIS — M25512 Pain in left shoulder: Secondary | ICD-10-CM | POA: Diagnosis not present

## 2016-06-17 DIAGNOSIS — M75112 Incomplete rotator cuff tear or rupture of left shoulder, not specified as traumatic: Secondary | ICD-10-CM | POA: Diagnosis not present

## 2016-06-30 ENCOUNTER — Encounter (HOSPITAL_BASED_OUTPATIENT_CLINIC_OR_DEPARTMENT_OTHER): Payer: Self-pay | Admitting: *Deleted

## 2016-06-30 NOTE — H&P (Signed)
Nicholas Ellison comes in for follow up.  MRI complete of the left shoulder.  Injection we did was great with Marcaine in place and it has allowed him to sleep at night.  Unfortunately it hasn't really improved function.  As long as he keeps his arm at his side things are tolerable, but anything beyond that is very painful.  He had an arthroscopy and decompression 25 years ago.  He has done well until recently.  I have none of his previous notes or workup.  MRI recently completed, as things did not improve.  Significant partial thickness tearing articular surface of the subscapularis with some tendinopathy of the supraspinatus, as well as subscapularis.  Biceps tendinopathy.  Mild to moderate degenerative changes of the [facility] joint with some recurrent subacromial spurring that is most prominent over near the [facility] joint.  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  Again, I emphasized his symptoms are now longstanding, going on for a number of years, but recently this has gotten completely intolerable.  I went over previous treatment, workup and MRI report, as well as all of the images.  He is quite miserable and he would like to do something definitive.   History and general exam are reviewed.    EXAMINATION: Specifically, I can still only get about 50% of his motion, both active and passive.  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  There is no instability.  AC joint is sore.  No notable atrophy.   X-RAYS: I got a repeat outlet view just to look at the acromial spur, which is definitely present over near the [facility] joint and in front of the acromion.    DISPOSITION:  We have discussed definitive treatment.  Although he has a partial tear of his subscapularis and pain when I resist that, I don't think that is anything that is going to require repair.  The approach I am anticipating is exam under anesthesia, arthroscopy, decompression, debridement,  distal clavicle excision and possible biceps release.  I don't think I am going to have to do anything open.  What is involved intra and post-op reviewed.  Again, I have told him that if there is a full thickness tear I am going to fix that and it is going to change his post-op protocol, but again I doubt that.  Paperwork complete.  All questions answered.  More than 40 minutes spent face-to-face covering all of this with him.  Work is getting harder and harder.  Our hope is to get him through the early phase of rehab and back to work in a couple of weeks, but he knows it is going to be a couple of months before rehab is complete

## 2016-07-01 ENCOUNTER — Encounter (HOSPITAL_COMMUNITY)
Admission: RE | Admit: 2016-07-01 | Discharge: 2016-07-01 | Disposition: A | Discharge status: Home / Self Care | Payer: BLUE CROSS/BLUE SHIELD | Source: Ambulatory Visit | Attending: Orthopedic Surgery | Admitting: Orthopedic Surgery

## 2016-07-01 DIAGNOSIS — M75112 Incomplete rotator cuff tear or rupture of left shoulder, not specified as traumatic: Secondary | ICD-10-CM | POA: Diagnosis not present

## 2016-07-01 DIAGNOSIS — M19012 Primary osteoarthritis, left shoulder: Secondary | ICD-10-CM | POA: Diagnosis not present

## 2016-07-01 DIAGNOSIS — Z87891 Personal history of nicotine dependence: Secondary | ICD-10-CM | POA: Diagnosis not present

## 2016-07-01 DIAGNOSIS — M7522 Bicipital tendinitis, left shoulder: Secondary | ICD-10-CM | POA: Diagnosis not present

## 2016-07-01 DIAGNOSIS — X58XXXA Exposure to other specified factors, initial encounter: Secondary | ICD-10-CM | POA: Diagnosis not present

## 2016-07-01 DIAGNOSIS — I10 Essential (primary) hypertension: Secondary | ICD-10-CM | POA: Diagnosis not present

## 2016-07-01 DIAGNOSIS — S43432A Superior glenoid labrum lesion of left shoulder, initial encounter: Secondary | ICD-10-CM | POA: Diagnosis not present

## 2016-07-01 DIAGNOSIS — M7542 Impingement syndrome of left shoulder: Secondary | ICD-10-CM | POA: Diagnosis not present

## 2016-07-01 DIAGNOSIS — M7502 Adhesive capsulitis of left shoulder: Secondary | ICD-10-CM | POA: Diagnosis not present

## 2016-07-01 DIAGNOSIS — K219 Gastro-esophageal reflux disease without esophagitis: Secondary | ICD-10-CM | POA: Diagnosis not present

## 2016-07-03 ENCOUNTER — Ambulatory Visit (HOSPITAL_BASED_OUTPATIENT_CLINIC_OR_DEPARTMENT_OTHER)
Admission: RE | Admit: 2016-07-03 | Discharge: 2016-07-03 | Disposition: A | Discharge status: Home / Self Care | Payer: BLUE CROSS/BLUE SHIELD | Source: Ambulatory Visit | Attending: Orthopedic Surgery | Admitting: Orthopedic Surgery

## 2016-07-03 ENCOUNTER — Encounter (HOSPITAL_BASED_OUTPATIENT_CLINIC_OR_DEPARTMENT_OTHER): Payer: Self-pay | Admitting: Anesthesiology

## 2016-07-03 ENCOUNTER — Encounter (HOSPITAL_BASED_OUTPATIENT_CLINIC_OR_DEPARTMENT_OTHER)
Admission: RE | Disposition: A | Discharge status: Home / Self Care | Payer: Self-pay | Source: Ambulatory Visit | Attending: Orthopedic Surgery

## 2016-07-03 ENCOUNTER — Ambulatory Visit (HOSPITAL_BASED_OUTPATIENT_CLINIC_OR_DEPARTMENT_OTHER): Payer: BLUE CROSS/BLUE SHIELD | Admitting: Anesthesiology

## 2016-07-03 DIAGNOSIS — M7522 Bicipital tendinitis, left shoulder: Secondary | ICD-10-CM | POA: Insufficient documentation

## 2016-07-03 DIAGNOSIS — I10 Essential (primary) hypertension: Secondary | ICD-10-CM | POA: Diagnosis not present

## 2016-07-03 DIAGNOSIS — S43432A Superior glenoid labrum lesion of left shoulder, initial encounter: Secondary | ICD-10-CM | POA: Diagnosis not present

## 2016-07-03 DIAGNOSIS — M7502 Adhesive capsulitis of left shoulder: Secondary | ICD-10-CM | POA: Diagnosis not present

## 2016-07-03 DIAGNOSIS — K219 Gastro-esophageal reflux disease without esophagitis: Secondary | ICD-10-CM | POA: Diagnosis not present

## 2016-07-03 DIAGNOSIS — X58XXXA Exposure to other specified factors, initial encounter: Secondary | ICD-10-CM | POA: Insufficient documentation

## 2016-07-03 DIAGNOSIS — M19012 Primary osteoarthritis, left shoulder: Secondary | ICD-10-CM | POA: Insufficient documentation

## 2016-07-03 DIAGNOSIS — M75112 Incomplete rotator cuff tear or rupture of left shoulder, not specified as traumatic: Secondary | ICD-10-CM | POA: Diagnosis not present

## 2016-07-03 DIAGNOSIS — M24112 Other articular cartilage disorders, left shoulder: Secondary | ICD-10-CM | POA: Diagnosis not present

## 2016-07-03 DIAGNOSIS — M7542 Impingement syndrome of left shoulder: Secondary | ICD-10-CM | POA: Diagnosis not present

## 2016-07-03 DIAGNOSIS — Z87891 Personal history of nicotine dependence: Secondary | ICD-10-CM | POA: Insufficient documentation

## 2016-07-03 DIAGNOSIS — M75102 Unspecified rotator cuff tear or rupture of left shoulder, not specified as traumatic: Secondary | ICD-10-CM | POA: Diagnosis not present

## 2016-07-03 DIAGNOSIS — G8918 Other acute postprocedural pain: Secondary | ICD-10-CM | POA: Diagnosis not present

## 2016-07-03 HISTORY — DX: Crohn's disease, unspecified, without complications: K50.90

## 2016-07-03 SURGERY — SHOULDER ARTHROSCOPY WITH SUBACROMIAL DECOMPRESSION AND DISTAL CLAVICLE EXCISION
Anesthesia: General | Site: Shoulder | Laterality: Left

## 2016-07-03 MED ORDER — SCOPOLAMINE 1 MG/3DAYS TD PT72: 1.0000 | MEDICATED_PATCH | Freq: Once | TRANSDERMAL | Status: DC | PRN

## 2016-07-03 MED ORDER — OXYCODONE HCL 5 MG/5ML PO SOLN: 5.0000 mg | Freq: Once | ORAL | Status: DC | PRN

## 2016-07-03 MED ORDER — LIDOCAINE 2% (20 MG/ML) 5 ML SYRINGE
INTRAMUSCULAR | Status: AC
  Filled 2016-07-03: qty 5

## 2016-07-03 MED ORDER — FENTANYL CITRATE (PF) 100 MCG/2ML IJ SOLN
50.0000 ug | INTRAMUSCULAR | Status: DC | PRN
Start: 2016-07-03 — End: 2016-07-03
  Administered 2016-07-03: 100 ug via INTRAVENOUS
  Administered 2016-07-03: 50 ug via INTRAVENOUS

## 2016-07-03 MED ORDER — LACTATED RINGERS IV SOLN
INTRAVENOUS | Status: DC
  Administered 2016-07-03 (×2): via INTRAVENOUS

## 2016-07-03 MED ORDER — OXYCODONE-ACETAMINOPHEN 5-325 MG PO TABS: 1.0000 | ORAL_TABLET | ORAL | 0 refills | Status: DC | PRN

## 2016-07-03 MED ORDER — ONDANSETRON HCL 4 MG/2ML IJ SOLN
INTRAMUSCULAR | Status: AC
  Filled 2016-07-03: qty 2

## 2016-07-03 MED ORDER — GLYCOPYRROLATE 0.2 MG/ML IV SOSY
PREFILLED_SYRINGE | INTRAVENOUS | Status: DC | PRN
  Administered 2016-07-03: .2 mg via INTRAVENOUS

## 2016-07-03 MED ORDER — LACTATED RINGERS IV SOLN: INTRAVENOUS | Status: DC

## 2016-07-03 MED ORDER — LIDOCAINE HCL (CARDIAC) 20 MG/ML IV SOLN
INTRAVENOUS | Status: DC | PRN
  Administered 2016-07-03: 50 mg via INTRAVENOUS

## 2016-07-03 MED ORDER — SODIUM CHLORIDE 0.9 % IR SOLN
Status: DC | PRN
  Administered 2016-07-03: 6000 mL

## 2016-07-03 MED ORDER — ONDANSETRON HCL 4 MG/2ML IJ SOLN: 4.0000 mg | Freq: Four times a day (QID) | INTRAMUSCULAR | Status: DC | PRN

## 2016-07-03 MED ORDER — DEXAMETHASONE SODIUM PHOSPHATE 4 MG/ML IJ SOLN
INTRAMUSCULAR | Status: DC | PRN
  Administered 2016-07-03: 10 mg via INTRAVENOUS

## 2016-07-03 MED ORDER — CHLORHEXIDINE GLUCONATE 4 % EX LIQD: 60.0000 mL | Freq: Once | CUTANEOUS | Status: DC

## 2016-07-03 MED ORDER — CEFAZOLIN SODIUM-DEXTROSE 2-4 GM/100ML-% IV SOLN
2.0000 g | INTRAVENOUS | Status: AC
  Administered 2016-07-03: 2 g via INTRAVENOUS

## 2016-07-03 MED ORDER — PROPOFOL 10 MG/ML IV BOLUS
INTRAVENOUS | Status: DC | PRN
  Administered 2016-07-03: 200 mg via INTRAVENOUS

## 2016-07-03 MED ORDER — FENTANYL CITRATE (PF) 100 MCG/2ML IJ SOLN
INTRAMUSCULAR | Status: AC
  Filled 2016-07-03: qty 2

## 2016-07-03 MED ORDER — MIDAZOLAM HCL 2 MG/2ML IJ SOLN
INTRAMUSCULAR | Status: AC
  Filled 2016-07-03: qty 2

## 2016-07-03 MED ORDER — SUCCINYLCHOLINE CHLORIDE 200 MG/10ML IV SOSY
PREFILLED_SYRINGE | INTRAVENOUS | Status: AC
  Filled 2016-07-03: qty 10

## 2016-07-03 MED ORDER — CEFAZOLIN SODIUM-DEXTROSE 2-4 GM/100ML-% IV SOLN
INTRAVENOUS | Status: AC
  Filled 2016-07-03: qty 100

## 2016-07-03 MED ORDER — EPHEDRINE 5 MG/ML INJ
INTRAVENOUS | Status: AC
  Filled 2016-07-03: qty 10

## 2016-07-03 MED ORDER — ONDANSETRON HCL 4 MG PO TABS: 4.0000 mg | ORAL_TABLET | Freq: Three times a day (TID) | ORAL | 0 refills | Status: DC | PRN

## 2016-07-03 MED ORDER — OXYCODONE HCL 5 MG PO TABS: 5.0000 mg | ORAL_TABLET | Freq: Once | ORAL | Status: DC | PRN

## 2016-07-03 MED ORDER — MIDAZOLAM HCL 2 MG/2ML IJ SOLN
1.0000 mg | INTRAMUSCULAR | Status: DC | PRN
  Administered 2016-07-03: 2 mg via INTRAVENOUS

## 2016-07-03 MED ORDER — DEXAMETHASONE SODIUM PHOSPHATE 10 MG/ML IJ SOLN
INTRAMUSCULAR | Status: AC
  Filled 2016-07-03: qty 1

## 2016-07-03 MED ORDER — SUCCINYLCHOLINE CHLORIDE 20 MG/ML IJ SOLN
INTRAMUSCULAR | Status: DC | PRN
  Administered 2016-07-03: 100 mg via INTRAVENOUS

## 2016-07-03 MED ORDER — FENTANYL CITRATE (PF) 100 MCG/2ML IJ SOLN: 25.0000 ug | INTRAMUSCULAR | Status: DC | PRN

## 2016-07-03 MED ORDER — BUPIVACAINE-EPINEPHRINE (PF) 0.5% -1:200000 IJ SOLN
INTRAMUSCULAR | Status: DC | PRN
  Administered 2016-07-03: 30 mL via PERINEURAL

## 2016-07-03 MED ORDER — EPHEDRINE SULFATE 50 MG/ML IJ SOLN
INTRAMUSCULAR | Status: DC | PRN
  Administered 2016-07-03: 5 mg via INTRAVENOUS
  Administered 2016-07-03 (×2): 15 mg via INTRAVENOUS
  Administered 2016-07-03: 10 mg via INTRAVENOUS

## 2016-07-03 SURGICAL SUPPLY — 73 items
APL SKNCLS STERI-STRIP NONHPOA (GAUZE/BANDAGES/DRESSINGS)
BENZOIN TINCTURE PRP APPL 2/3 (GAUZE/BANDAGES/DRESSINGS) IMPLANT
BLADE CUTTER GATOR 3.5 (BLADE) ×2 IMPLANT
BLADE CUTTER MENIS 5.5 (BLADE) IMPLANT
BLADE GREAT WHITE 4.2 (BLADE) ×2 IMPLANT
BLADE SURG 15 STRL LF DISP TIS (BLADE) ×1 IMPLANT
BLADE SURG 15 STRL SS (BLADE) ×2
BUR OVAL 6.0 (BURR) ×2 IMPLANT
CANNULA DRY DOC 8X75 (CANNULA) IMPLANT
CANNULA TWIST IN 8.25X7CM (CANNULA) IMPLANT
DECANTER SPIKE VIAL GLASS SM (MISCELLANEOUS) IMPLANT
DRAPE STERI 35X30 U-POUCH (DRAPES) ×2 IMPLANT
DRAPE U-SHAPE 47X51 STRL (DRAPES) ×2 IMPLANT
DRAPE U-SHAPE 76X120 STRL (DRAPES) ×4 IMPLANT
DRSG PAD ABDOMINAL 8X10 ST (GAUZE/BANDAGES/DRESSINGS) ×2 IMPLANT
DURAPREP 26ML APPLICATOR (WOUND CARE) ×2 IMPLANT
ELECT MENISCUS 165MM 90D (ELECTRODE) ×2 IMPLANT
ELECT REM PT RETURN 9FT ADLT (ELECTROSURGICAL) ×2
ELECTRODE REM PT RTRN 9FT ADLT (ELECTROSURGICAL) ×1 IMPLANT
GAUZE SPONGE 4X4 12PLY STRL (GAUZE/BANDAGES/DRESSINGS) ×4 IMPLANT
GAUZE XEROFORM 1X8 LF (GAUZE/BANDAGES/DRESSINGS) ×2 IMPLANT
GLOVE BIO SURGEON STRL SZ 6.5 (GLOVE) ×1 IMPLANT
GLOVE BIOGEL PI IND STRL 7.0 (GLOVE) ×1 IMPLANT
GLOVE BIOGEL PI IND STRL 8 (GLOVE) IMPLANT
GLOVE BIOGEL PI INDICATOR 7.0 (GLOVE) ×3
GLOVE BIOGEL PI INDICATOR 8 (GLOVE) ×1
GLOVE ECLIPSE 6.5 STRL STRAW (GLOVE) ×1 IMPLANT
GLOVE ECLIPSE 7.0 STRL STRAW (GLOVE) ×2 IMPLANT
GLOVE SURG ORTHO 8.0 STRL STRW (GLOVE) ×2 IMPLANT
GOWN STRL REUS W/ TWL LRG LVL3 (GOWN DISPOSABLE) ×1 IMPLANT
GOWN STRL REUS W/ TWL XL LVL3 (GOWN DISPOSABLE) ×2 IMPLANT
GOWN STRL REUS W/TWL LRG LVL3 (GOWN DISPOSABLE) ×2
GOWN STRL REUS W/TWL XL LVL3 (GOWN DISPOSABLE) ×4
IV NS IRRIG 3000ML ARTHROMATIC (IV SOLUTION) ×8 IMPLANT
MANIFOLD NEPTUNE II (INSTRUMENTS) ×2 IMPLANT
NDL SCORPION MULTI FIRE (NEEDLE) IMPLANT
NDL SUT 6 .5 CRC .975X.05 MAYO (NEEDLE) IMPLANT
NEEDLE MAYO TAPER (NEEDLE)
NEEDLE SCORPION MULTI FIRE (NEEDLE) IMPLANT
NS IRRIG 1000ML POUR BTL (IV SOLUTION) IMPLANT
PACK ARTHROSCOPY DSU (CUSTOM PROCEDURE TRAY) ×2 IMPLANT
PACK BASIN DAY SURGERY FS (CUSTOM PROCEDURE TRAY) ×2 IMPLANT
PASSER SUT SWANSON 36MM LOOP (INSTRUMENTS) IMPLANT
PENCIL BUTTON HOLSTER BLD 10FT (ELECTRODE) ×2 IMPLANT
SET ARTHROSCOPY TUBING (MISCELLANEOUS) ×2
SET ARTHROSCOPY TUBING LN (MISCELLANEOUS) ×1 IMPLANT
SLEEVE SCD COMPRESS KNEE MED (MISCELLANEOUS) IMPLANT
SLING ARM FOAM STRAP LRG (SOFTGOODS) ×1 IMPLANT
SLING ARM IMMOBILIZER LRG (SOFTGOODS) IMPLANT
SLING ARM IMMOBILIZER MED (SOFTGOODS) IMPLANT
SLING ARM MED ADULT FOAM STRAP (SOFTGOODS) IMPLANT
SLING ARM XL FOAM STRAP (SOFTGOODS) IMPLANT
SPONGE LAP 4X18 X RAY DECT (DISPOSABLE) IMPLANT
STRIP CLOSURE SKIN 1/2X4 (GAUZE/BANDAGES/DRESSINGS) IMPLANT
SUCTION FRAZIER HANDLE 10FR (MISCELLANEOUS)
SUCTION TUBE FRAZIER 10FR DISP (MISCELLANEOUS) IMPLANT
SUT ETHIBOND 2 OS 4 DA (SUTURE) IMPLANT
SUT ETHILON 2 0 FS 18 (SUTURE) IMPLANT
SUT ETHILON 3 0 PS 1 (SUTURE) IMPLANT
SUT FIBERWIRE #2 38 T-5 BLUE (SUTURE)
SUT RETRIEVER MED (INSTRUMENTS) IMPLANT
SUT TIGER TAPE 7 IN WHITE (SUTURE) IMPLANT
SUT VIC AB 0 CT1 27 (SUTURE)
SUT VIC AB 0 CT1 27XBRD ANBCTR (SUTURE) IMPLANT
SUT VIC AB 2-0 SH 27 (SUTURE)
SUT VIC AB 2-0 SH 27XBRD (SUTURE) IMPLANT
SUT VIC AB 3-0 FS2 27 (SUTURE) IMPLANT
SUTURE FIBERWR #2 38 T-5 BLUE (SUTURE) IMPLANT
TAPE FIBER 2MM 7IN #2 BLUE (SUTURE) IMPLANT
TOWEL OR 17X24 6PK STRL BLUE (TOWEL DISPOSABLE) ×2 IMPLANT
TOWEL OR NON WOVEN STRL DISP B (DISPOSABLE) ×2 IMPLANT
WATER STERILE IRR 1000ML POUR (IV SOLUTION) ×2 IMPLANT
YANKAUER SUCT BULB TIP NO VENT (SUCTIONS) IMPLANT

## 2016-07-03 NOTE — Anesthesia Preprocedure Evaluation (Signed)
Anesthesia Evaluation  Patient identified by MRN, date of birth, ID band Patient awake    Reviewed: Allergy & Precautions, H&P , NPO status , Patient's Chart, lab work & pertinent test results  Airway Mallampati: II   Neck ROM: full    Dental   Pulmonary former smoker,    breath sounds clear to auscultation       Cardiovascular hypertension,  Rhythm:regular Rate:Normal     Neuro/Psych    GI/Hepatic GERD  ,  Endo/Other    Renal/GU      Musculoskeletal  (+) Arthritis ,   Abdominal   Peds  Hematology   Anesthesia Other Findings   Reproductive/Obstetrics                             Anesthesia Physical Anesthesia Plan  ASA: II  Anesthesia Plan: General   Post-op Pain Management:  Regional for Post-op pain   Induction: Intravenous  Airway Management Planned: Oral ETT  Additional Equipment:   Intra-op Plan:   Post-operative Plan: Extubation in OR  Informed Consent: I have reviewed the patients History and Physical, chart, labs and discussed the procedure including the risks, benefits and alternatives for the proposed anesthesia with the patient or authorized representative who has indicated his/her understanding and acceptance.     Plan Discussed with: CRNA, Anesthesiologist and Surgeon  Anesthesia Plan Comments:         Anesthesia Quick Evaluation

## 2016-07-03 NOTE — Anesthesia Procedure Notes (Signed)
Procedure Name: Intubation Performed by: Terrance Mass Pre-anesthesia Checklist: Patient identified, Emergency Drugs available, Suction available and Patient being monitored Patient Re-evaluated:Patient Re-evaluated prior to inductionOxygen Delivery Method: Circle system utilized Preoxygenation: Pre-oxygenation with 100% oxygen Intubation Type: IV induction Ventilation: Mask ventilation without difficulty Laryngoscope Size: Miller and 2 Grade View: Grade II Tube type: Oral Tube size: 7.0 mm Number of attempts: 1 Airway Equipment and Method: Stylet and Oral airway Placement Confirmation: ETT inserted through vocal cords under direct vision,  positive ETCO2 and breath sounds checked- equal and bilateral Secured at: 22 cm Tube secured with: Tape Dental Injury: Teeth and Oropharynx as per pre-operative assessment

## 2016-07-03 NOTE — Transfer of Care (Signed)
Immediate Anesthesia Transfer of Care Note  Patient: Nicholas Ellison  Procedure(s) Performed: Procedure(s): LEFT SHOULDER ARTHROSCOPIC DEBRIDEMENT, ACROMIOPLASTY, DISTAL CLAVICAL EXCISION, BICEP TENODESIS, POSSIBLE ROTATOR CUFF REPAIR (Left)  Patient Location: PACU  Anesthesia Type:General  Level of Consciousness: awake and sedated  Airway & Oxygen Therapy: Patient Spontanous Breathing and Patient connected to face mask oxygen  Post-op Assessment: Report given to RN and Post -op Vital signs reviewed and stable  Post vital signs: Reviewed and stable  Last Vitals:  Vitals:   07/03/16 1130 07/03/16 1132  BP: 112/70   Pulse: 70 75  Resp: 11 13  Temp:      Last Pain:  Vitals:   07/03/16 1059  TempSrc: Oral         Complications: No apparent anesthesia complications

## 2016-07-03 NOTE — Progress Notes (Signed)
Assisted Dr. Marcie Bal with left, ultrasound guided, interscalene  block. Side rails up, monitors on throughout procedure. See vital signs in flow sheet. Tolerated Procedure well.

## 2016-07-03 NOTE — Anesthesia Postprocedure Evaluation (Signed)
Anesthesia Post Note  Patient: Nicholas Ellison  Procedure(s) Performed: Procedure(s) (LRB): LEFT SHOULDER ARTHROSCOPIC DEBRIDEMENT, ACROMIOPLASTY, DISTAL CLAVICAL EXCISION,  ROTATOR CUFF DEBRIDEMENT (Left)  Patient location during evaluation: PACU Anesthesia Type: General Level of consciousness: awake and alert and patient cooperative Pain management: pain level controlled Vital Signs Assessment: post-procedure vital signs reviewed and stable Respiratory status: spontaneous breathing and respiratory function stable Cardiovascular status: stable Anesthetic complications: no       Last Vitals:  Vitals:   07/03/16 1330 07/03/16 1345  BP: 131/82 135/81  Pulse: 87 85  Resp: 16 (!) 28  Temp:      Last Pain:  Vitals:   07/03/16 1330  TempSrc:   PainSc: 0-No pain                 Mohogany Toppins S

## 2016-07-03 NOTE — Discharge Instructions (Signed)
Shouder arthroscopy, partial rotator cuff tear debridement subacromial decompression Care After Instructions Refer to this sheet in the next few weeks. These discharge instructions provide you with general information on caring for yourself after you leave the hospital. Your caregiver may also give you specific instructions. Your treatment has been planned according to the most current medical practices available, but unavoidable complications sometimes occur. If you have any problems or questions after discharge, please call your caregiver. HOME INSTRUCTIONS You may resume a normal diet and activities as directed. Take showers instead of baths until informed otherwise.  Change bandages (dressings) in 3 days.  Swab wounds daily with betadine.  Wash shoulder with soap and water.  Pat dry.  Cover wounds with bandaids. Only take over-the-counter or prescription medicines for pain, discomfort, or fever as directed by your caregiver.  Wear your sling for the next 2 days unless otherwise instructed. Eat a well-balanced diet.  Avoid lifting or driving until you are instructed otherwise.  Make an appointment to see your caregiver for stitches (suture) or staple removal one week after surgery.  SEEK MEDICAL CARE IF: You have swelling of your calf or leg.  You develop shortness of breath or chest pain.  You have redness, swelling, or increasing pain in the wound.  There is pus or any unusual drainage coming from the surgical site.  You notice a bad smell coming from the surgical site or dressing.  The surgical site breaks open after sutures or staples have been removed.  There is persistent bleeding from the suture or staple line.  You are getting worse or are not improving.  You have any other questions or concerns.  SEEK IMMEDIATE MEDICAL CARE IF:  You have a fever greater than 101 You develop a rash.  You have difficulty breathing.  You develop any reaction or side effects to medicines given.    Your knee motion is decreasing rather than improving.  MAKE SURE YOU:  Understand these instructions.  Will watch your condition.  Will get help right away if you are not doing well or get worse.    Post Anesthesia Home Care Instructions  Activity: Get plenty of rest for the remainder of the day. A responsible adult should stay with you for 24 hours following the procedure.  For the next 24 hours, DO NOT: -Drive a car -Paediatric nurse -Drink alcoholic beverages -Take any medication unless instructed by your physician -Make any legal decisions or sign important papers.  Meals: Start with liquid foods such as gelatin or soup. Progress to regular foods as tolerated. Avoid greasy, spicy, heavy foods. If nausea and/or vomiting occur, drink only clear liquids until the nausea and/or vomiting subsides. Call your physician if vomiting continues.  Special Instructions/Symptoms: Your throat may feel dry or sore from the anesthesia or the breathing tube placed in your throat during surgery. If this causes discomfort, gargle with warm salt water. The discomfort should disappear within 24 hours.  If you had a scopolamine patch placed behind your ear for the management of post- operative nausea and/or vomiting:  1. The medication in the patch is effective for 72 hours, after which it should be removed.  Wrap patch in a tissue and discard in the trash. Wash hands thoroughly with soap and water. 2. You may remove the patch earlier than 72 hours if you experience unpleasant side effects which may include dry mouth, dizziness or visual disturbances. 3. Avoid touching the patch. Wash your hands with soap and water after contact  with the patch.    Regional Anesthesia Blocks  1. Numbness or the inability to move the "blocked" extremity may last from 3-48 hours after placement. The length of time depends on the medication injected and your individual response to the medication. If the numbness is not  going away after 48 hours, call your surgeon.  2. The extremity that is blocked will need to be protected until the numbness is gone and the  Strength has returned. Because you cannot feel it, you will need to take extra care to avoid injury. Because it may be weak, you may have difficulty moving it or using it. You may not know what position it is in without looking at it while the block is in effect.  3. For blocks in the legs and feet, returning to weight bearing and walking needs to be done carefully. You will need to wait until the numbness is entirely gone and the strength has returned. You should be able to move your leg and foot normally before you try and bear weight or walk. You will need someone to be with you when you first try to ensure you do not fall and possibly risk injury.  4. Bruising and tenderness at the needle site are common side effects and will resolve in a few days.  5. Persistent numbness or new problems with movement should be communicated to the surgeon or the [place] [phone] [place] [phone]).

## 2016-07-03 NOTE — Anesthesia Procedure Notes (Signed)
Anesthesia Regional Block: Interscalene brachial plexus block   Pre-Anesthetic Checklist: ,, timeout performed, Correct Patient, Correct Site, Correct Laterality, Correct Procedure, Correct Position, site marked, Risks and benefits discussed,  Surgical consent,  Pre-op evaluation,  At surgeon's request and post-op pain management  Laterality: Left  Prep: chloraprep       Needles:  Injection technique: Single-shot  Needle Type: Echogenic Stimulator Needle     Needle Length: 5cm  Needle Gauge: 22     Additional Needles:   Procedures: ultrasound guided, nerve stimulator,,,,,,   Nerve Stimulator or Paresthesia:  Response: biceps flexion, 0.45 mA,   Additional Responses:   Narrative:  Start time: 07/03/2016 11:18 AM End time: 07/03/2016 11:25 AM Injection made incrementally with aspirations every 5 mL.  Performed by: Personally  Anesthesiologist: Virgene Tirone  Additional Notes: Functioning IV was confirmed and monitors were applied.  A 35m 22ga Arrow echogenic stimulator needle was used. Sterile prep and drape,hand hygiene and sterile gloves were used.  Negative aspiration and negative test dose prior to incremental administration of local anesthetic. The patient tolerated the procedure well.  Ultrasound guidance: relevent anatomy identified, needle position confirmed, local anesthetic spread visualized around nerve(s), vascular puncture avoided.  Image printed for medical record.

## 2016-07-03 NOTE — Interval H&P Note (Signed)
History and Physical Interval Note:  07/03/2016 11:35 AM  [name]  has presented today for surgery, with the diagnosis of OTHER ARTICULAR CARTILAGE DISORDER, LEFT SHOULDER, PRIMARY OSTEOARTHRITIS, LEFT SHOULDER, IMPINGEMENT SYNDROME OF LEFT SHOULDER, STRAIN OF MUSCLE AND TENDON OF THE ROTATOR CUFF  The various methods of treatment have been discussed with the patient and family. After consideration of risks, benefits and other options for treatment, the patient has consented to  Procedure(s): LEFT SHOULDER ARTHROSCOPIC DEBRIDEMENT, ACROMIOPLASTY, DISTAL CLAVICAL EXCISION, BICEP TENODESIS, POSSIBLE ROTATOR CUFF REPAIR (Left) as a surgical intervention .  The patient's history has been reviewed, patient examined, no change in status, stable for surgery.  I have reviewed the patient's chart and labs.  Questions were answered to the patient's satisfaction.     Nicholas Ellison

## 2016-07-04 NOTE — Op Note (Signed)
[name], BUTH NO.:  [id number]  MEDICAL RECORD NO.:  [id number]  LOCATION:                                 FACILITY:  PHYSICIAN:  [name], M.D. DATE OF BIRTH:  26-Jan-1956  DATE OF PROCEDURE:  07/03/2016 DATE OF DISCHARGE:                              OPERATIVE REPORT   PREOPERATIVE DIAGNOSES:  Left shoulder impingement, partial tear of rotator cuff, and degenerative joint disease, acromioclavicular joint.  POSTOPERATIVE DIAGNOSES:  Left shoulder impingement, partial tear of rotator cuff, and degenerative joint disease, acromioclavicular joint with partial thickness tearing supraspinatus as well as subscap tendon. 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  Also anterior labrum tear.  PROCEDURE:  Left shoulder exam under anesthesia with manipulation. Arthroscopy with lysis and debridement of adhesions.  Debridement of rotator cuff above and below.  Debridement of labrum.  Bursectomy, acromioplasty, coracoacromial ligament release.  Excision of distal clavicle.  SURGEON:  [name], M.D.  ASSISTANT:  Elmyra Ricks, PA, present throughout the entire case and necessary for timely completion of procedure.  ANESTHESIA:  General.  BLOOD LOSS:  Minimal.  SPECIMENS:  None.  COMPLICATIONS:  None.  DRESSINGS:  Soft compressive with sling.  PROCEDURE DESCRIPTION:  The patient was brought to the operating room and after adequate anesthesia had been obtained.  Left shoulder was examined.  Lacking 15% to 20% of motion with adhesions preventing overhead abduction and forward flexion.  Gently manipulated, breaking up all adhesions.  Achieving full motion stable shoulder.  Placed in a beach-chair position on the shoulder positioner, prepped and draped in usual sterile fashion.  Three portals anterior, posterior, and lateral. Arthroscope introduced, shoulder distended and inspected.  Residual adhesions  debrided.  Partial tearing on top of the subscap debrided. Longitudinally intact.  Fraying tearing undersurface supraspinatus crescent region debrided.  Biceps tendon, biceps anchor, and biceps exiting the shoulder all intact.  Anterior labrum debrided.  Articular cartilage did not look bad.  Cannula redirected subacromially.  A lot of adhesions, bursitis debrided.  Acromioplasty type 2 to type 1 acromion releasing CA ligament.  Grade 4 changes spurring AC joint. Periarticular spurs, lateral centimeter of clavicle resected.  Adequacy of decompression confirmed viewing from all portals.  Instruments were fully removed. Portals were closed with nylon.  Sterile compressive dressing applied. Anesthesia reversed.  Brought to the recovery room.  Tolerated the surgery well.  No complications.     [name], M.D.   ______________________________ [name], M.D.    DFM/MEDQ  D:  07/03/2016  T:  07/04/2016  Job:  [id number]

## 2016-07-11 ENCOUNTER — Encounter (HOSPITAL_COMMUNITY): Payer: BLUE CROSS/BLUE SHIELD

## 2016-07-11 DIAGNOSIS — M19012 Primary osteoarthritis, left shoulder: Secondary | ICD-10-CM | POA: Diagnosis not present

## 2016-07-14 DIAGNOSIS — M7542 Impingement syndrome of left shoulder: Secondary | ICD-10-CM | POA: Diagnosis not present

## 2016-07-14 DIAGNOSIS — M25512 Pain in left shoulder: Secondary | ICD-10-CM | POA: Diagnosis not present

## 2016-07-17 DIAGNOSIS — M7542 Impingement syndrome of left shoulder: Secondary | ICD-10-CM | POA: Diagnosis not present

## 2016-07-17 DIAGNOSIS — M25512 Pain in left shoulder: Secondary | ICD-10-CM | POA: Diagnosis not present

## 2016-07-18 ENCOUNTER — Encounter (HOSPITAL_COMMUNITY)
Admission: RE | Admit: 2016-07-18 | Discharge: 2016-07-18 | Disposition: A | Discharge status: Home / Self Care | Payer: BLUE CROSS/BLUE SHIELD | Source: Ambulatory Visit | Attending: Internal Medicine | Admitting: Internal Medicine

## 2016-07-18 DIAGNOSIS — K50118 Crohn's disease of large intestine with other complication: Secondary | ICD-10-CM | POA: Insufficient documentation

## 2016-07-18 MED ORDER — SODIUM CHLORIDE 0.9 % IV SOLN
Freq: Once | INTRAVENOUS | Status: AC
  Administered 2016-07-18: 250 mL via INTRAVENOUS

## 2016-07-18 MED ORDER — SODIUM CHLORIDE 0.9 % IV SOLN
5.0000 mg/kg | Freq: Once | INTRAVENOUS | Status: DC
  Filled 2016-07-18: qty 40

## 2016-07-18 MED ORDER — SODIUM CHLORIDE 0.9 % IV SOLN
400.0000 mg | Freq: Once | INTRAVENOUS | Status: AC
  Administered 2016-07-18: 400 mg via INTRAVENOUS
  Filled 2016-07-18: qty 40

## 2016-07-18 NOTE — Progress Notes (Signed)
States took tylenol and zyrtec at home before coming to hospital.

## 2016-07-21 DIAGNOSIS — M25512 Pain in left shoulder: Secondary | ICD-10-CM | POA: Diagnosis not present

## 2016-07-21 DIAGNOSIS — M7542 Impingement syndrome of left shoulder: Secondary | ICD-10-CM | POA: Diagnosis not present

## 2016-07-24 DIAGNOSIS — M25512 Pain in left shoulder: Secondary | ICD-10-CM | POA: Diagnosis not present

## 2016-07-24 DIAGNOSIS — M7542 Impingement syndrome of left shoulder: Secondary | ICD-10-CM | POA: Diagnosis not present

## 2016-07-29 DIAGNOSIS — M25512 Pain in left shoulder: Secondary | ICD-10-CM | POA: Diagnosis not present

## 2016-07-29 DIAGNOSIS — M7542 Impingement syndrome of left shoulder: Secondary | ICD-10-CM | POA: Diagnosis not present

## 2016-07-31 DIAGNOSIS — M7542 Impingement syndrome of left shoulder: Secondary | ICD-10-CM | POA: Diagnosis not present

## 2016-07-31 DIAGNOSIS — M25512 Pain in left shoulder: Secondary | ICD-10-CM | POA: Diagnosis not present

## 2016-08-05 DIAGNOSIS — M25512 Pain in left shoulder: Secondary | ICD-10-CM | POA: Diagnosis not present

## 2016-08-05 DIAGNOSIS — M7542 Impingement syndrome of left shoulder: Secondary | ICD-10-CM | POA: Diagnosis not present

## 2016-08-07 DIAGNOSIS — M25512 Pain in left shoulder: Secondary | ICD-10-CM | POA: Diagnosis not present

## 2016-08-07 DIAGNOSIS — M7542 Impingement syndrome of left shoulder: Secondary | ICD-10-CM | POA: Diagnosis not present

## 2016-08-12 DIAGNOSIS — M25512 Pain in left shoulder: Secondary | ICD-10-CM | POA: Diagnosis not present

## 2016-08-12 DIAGNOSIS — M7542 Impingement syndrome of left shoulder: Secondary | ICD-10-CM | POA: Diagnosis not present

## 2016-08-14 DIAGNOSIS — M7542 Impingement syndrome of left shoulder: Secondary | ICD-10-CM | POA: Diagnosis not present

## 2016-08-14 DIAGNOSIS — M25512 Pain in left shoulder: Secondary | ICD-10-CM | POA: Diagnosis not present

## 2016-08-22 DIAGNOSIS — M25512 Pain in left shoulder: Secondary | ICD-10-CM | POA: Diagnosis not present

## 2016-08-22 DIAGNOSIS — M7542 Impingement syndrome of left shoulder: Secondary | ICD-10-CM | POA: Diagnosis not present

## 2016-08-26 DIAGNOSIS — M25512 Pain in left shoulder: Secondary | ICD-10-CM | POA: Diagnosis not present

## 2016-08-26 DIAGNOSIS — M7542 Impingement syndrome of left shoulder: Secondary | ICD-10-CM | POA: Diagnosis not present

## 2016-08-28 DIAGNOSIS — M7542 Impingement syndrome of left shoulder: Secondary | ICD-10-CM | POA: Diagnosis not present

## 2016-08-28 DIAGNOSIS — M25512 Pain in left shoulder: Secondary | ICD-10-CM | POA: Diagnosis not present

## 2016-09-02 DIAGNOSIS — M25512 Pain in left shoulder: Secondary | ICD-10-CM | POA: Diagnosis not present

## 2016-09-02 DIAGNOSIS — M7542 Impingement syndrome of left shoulder: Secondary | ICD-10-CM | POA: Diagnosis not present

## 2016-09-04 DIAGNOSIS — M7542 Impingement syndrome of left shoulder: Secondary | ICD-10-CM | POA: Diagnosis not present

## 2016-09-04 DIAGNOSIS — M25512 Pain in left shoulder: Secondary | ICD-10-CM | POA: Diagnosis not present

## 2016-09-09 DIAGNOSIS — M7542 Impingement syndrome of left shoulder: Secondary | ICD-10-CM | POA: Diagnosis not present

## 2016-09-09 DIAGNOSIS — M25512 Pain in left shoulder: Secondary | ICD-10-CM | POA: Diagnosis not present

## 2016-09-11 DIAGNOSIS — M25512 Pain in left shoulder: Secondary | ICD-10-CM | POA: Diagnosis not present

## 2016-09-11 DIAGNOSIS — M7542 Impingement syndrome of left shoulder: Secondary | ICD-10-CM | POA: Diagnosis not present

## 2016-09-12 ENCOUNTER — Encounter (HOSPITAL_COMMUNITY): Payer: Self-pay

## 2016-09-12 ENCOUNTER — Encounter (HOSPITAL_COMMUNITY)
Admission: RE | Admit: 2016-09-12 | Discharge: 2016-09-12 | Disposition: A | Discharge status: Home / Self Care | Payer: BLUE CROSS/BLUE SHIELD | Source: Ambulatory Visit | Attending: Internal Medicine | Admitting: Internal Medicine

## 2016-09-12 DIAGNOSIS — K50118 Crohn's disease of large intestine with other complication: Secondary | ICD-10-CM | POA: Insufficient documentation

## 2016-09-12 MED ORDER — SODIUM CHLORIDE 0.9 % IV SOLN
5.0000 mg/kg | Freq: Once | INTRAVENOUS | Status: AC
  Administered 2016-09-12: 400 mg via INTRAVENOUS
  Filled 2016-09-12: qty 40

## 2016-09-12 MED ORDER — SODIUM CHLORIDE 0.9 % IV SOLN
Freq: Once | INTRAVENOUS | Status: AC
  Administered 2016-09-12: 09:00:00 via INTRAVENOUS

## 2016-09-16 DIAGNOSIS — M19012 Primary osteoarthritis, left shoulder: Secondary | ICD-10-CM | POA: Diagnosis not present

## 2016-09-18 DIAGNOSIS — M6281 Muscle weakness (generalized): Secondary | ICD-10-CM | POA: Diagnosis not present

## 2016-09-18 DIAGNOSIS — M7542 Impingement syndrome of left shoulder: Secondary | ICD-10-CM | POA: Diagnosis not present

## 2016-09-18 DIAGNOSIS — M25612 Stiffness of left shoulder, not elsewhere classified: Secondary | ICD-10-CM | POA: Diagnosis not present

## 2016-09-18 DIAGNOSIS — M25512 Pain in left shoulder: Secondary | ICD-10-CM | POA: Diagnosis not present

## 2016-10-21 ENCOUNTER — Ambulatory Visit (INDEPENDENT_AMBULATORY_CARE_PROVIDER_SITE_OTHER): Payer: BLUE CROSS/BLUE SHIELD | Admitting: Internal Medicine

## 2016-10-21 ENCOUNTER — Encounter (INDEPENDENT_AMBULATORY_CARE_PROVIDER_SITE_OTHER): Payer: Self-pay | Admitting: Internal Medicine

## 2016-10-21 ENCOUNTER — Encounter (INDEPENDENT_AMBULATORY_CARE_PROVIDER_SITE_OTHER): Payer: Self-pay

## 2016-10-21 VITALS — BP 122/82 | HR 78 | Temp 97.2°F | Resp 18 | Ht 67.0 in | Wt 199.4 lb

## 2016-10-21 DIAGNOSIS — K5010 Crohn's disease of large intestine without complications: Secondary | ICD-10-CM | POA: Diagnosis not present

## 2016-10-21 NOTE — Patient Instructions (Addendum)
Please send [place] a copy a few next blood work. Will change infliximab interval to every 10 weeks. He is call office if you experience diarrhea abdominal pain rectal bleeding.

## 2016-10-21 NOTE — Progress Notes (Signed)
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  Subjective:  Nicholas Ellison is 61 year old Caucasian male who is here for scheduled visit. He was last seen in December 2017. She remains on infliximab infusion every 8 weeks. He states he has fairly good coverage. He states if it was not for his medications he would've retired. He has no symptoms whatsoever. He has one to 2 stools daily. 75% of his stools are formed and rest exam of formed. He does not even remember the last time he saw blood with his bowel movement. He denies abdominal pain or diarrhea. He has very good appetite. He has gained 8 pounds since his last visit. Interested in increasing internal of infliximab since he is doing well.  Current Medications: Outpatient Encounter Prescriptions as of 10/21/2016  Medication Sig  . InFLIXimab (REMICADE IV) Inject 100 mg into the vein. Patient last infusion was 03/23/15. Next one due 04/20/15.  [name] loratadine (CLARITIN) 10 MG tablet Take 10 mg by mouth daily as needed.   [name] losartan (COZAAR) 50 MG tablet Take 50 mg by mouth daily.  [name] omeprazole (PRILOSEC) 20 MG capsule Take 1 capsule (20 mg total) by mouth daily.  . Probiotic Product (PROBIOTIC PO) Take by mouth daily. Gummy form  . [DISCONTINUED] ondansetron (ZOFRAN) 4 MG tablet Take 1 tablet (4 mg total) by mouth every 8 (eight) hours as needed for nausea or vomiting. (Patient not taking: Reported on 10/21/2016)  . [DISCONTINUED] oxyCODONE-acetaminophen (PERCOCET) 5-325 MG tablet Take 1-2 tablets by mouth every 4 (four) hours as needed for severe pain. (Patient not taking: Reported on 10/21/2016)   No facility-administered encounter medications on file as of 10/21/2016.      Objective: Blood pressure 122/82, pulse 78, temperature 97.2 F (36.2 C), temperature source Oral, resp. rate 18, height 5' 7"  (1.702 m), weight 199 lb 6.4 oz (90.4 kg). She is alert and in no acute distress. Conjunctiva is pink. Sclera is  nonicteric Oropharyngeal mucosa is normal. No neck masses or thyromegaly noted. Cardiac exam with regular rhythm normal S1 and S2. No murmur or gallop noted. Lungs are clear to auscultation. Abdomen is full but soft and nontender without organomegaly or masses.  No LE edema or clubbing noted.   Assessment:  #1. Segmental colitis associated with diverticulosis. He has been on infliximab since November 2016 and remains in remission. Therefore will be reasonable to increase interval between infusions to 10 weeks. And could check drug levels. However cost would be an issue.   Plan:  Change infliximab infusion schedule to every 10 weeks. He will call if he experiences abdominal pain diarrhea or rectal bleeding. She will try to get a copy of his next routine blood work. Office visit in 6 months.

## 2016-11-03 ENCOUNTER — Ambulatory Visit (INDEPENDENT_AMBULATORY_CARE_PROVIDER_SITE_OTHER): Payer: BLUE CROSS/BLUE SHIELD | Admitting: Internal Medicine

## 2016-11-03 ENCOUNTER — Encounter (INDEPENDENT_AMBULATORY_CARE_PROVIDER_SITE_OTHER): Payer: Self-pay | Admitting: Internal Medicine

## 2016-11-03 VITALS — BP 120/80 | HR 80 | Temp 99.0°F | Ht 67.0 in | Wt 196.7 lb

## 2016-11-03 DIAGNOSIS — K5010 Crohn's disease of large intestine without complications: Secondary | ICD-10-CM | POA: Diagnosis not present

## 2016-11-03 LAB — CBC WITH DIFFERENTIAL/PLATELET
BASOS ABS: 80 {cells}/uL (ref 0–200)
Basophils Relative: 1 %
EOS ABS: 0 {cells}/uL — AB (ref 15–500)
Eosinophils Relative: 0 %
HEMATOCRIT: 38.7 % (ref 38.5–50.0)
Hemoglobin: 13.1 g/dL — ABNORMAL LOW (ref 13.2–17.1)
LYMPHS PCT: 18 %
Lymphs Abs: 1440 cells/uL (ref 850–3900)
MCH: 29.7 pg (ref 27.0–33.0)
MCHC: 33.9 g/dL (ref 32.0–36.0)
MCV: 87.8 fL (ref 80.0–100.0)
MONO ABS: 880 {cells}/uL (ref 200–950)
MPV: 10.3 fL (ref 7.5–12.5)
Monocytes Relative: 11 %
NEUTROS PCT: 70 %
Neutro Abs: 5600 cells/uL (ref 1500–7800)
Platelets: 223 10*3/uL (ref 140–400)
RBC: 4.41 MIL/uL (ref 4.20–5.80)
RDW: 13.9 % (ref 11.0–15.0)
WBC: 8 10*3/uL (ref 3.8–10.8)

## 2016-11-03 MED ORDER — METRONIDAZOLE 250 MG PO TABS: 250.0000 mg | ORAL_TABLET | Freq: Three times a day (TID) | ORAL | 0 refills | Status: DC

## 2016-11-03 MED ORDER — CIPROFLOXACIN HCL 500 MG PO TABS: 500.0000 mg | ORAL_TABLET | Freq: Two times a day (BID) | ORAL | 0 refills | Status: DC

## 2016-11-03 NOTE — Progress Notes (Signed)
   Subjective:    Patient ID: Nicholas Ellison, male    DOB: 08/19/1955, 61 y.o.   MRN: [id number]  HPI  Last seen by Dr Laural Golden 10/21/2016.  Hx of segmental colitis. Maintained on Infliximab every 8 weeks.  Presents today with c/o LLQ pain. Pain started around lunch time yesterday. Also states he had a fever yesterday. Has had some abdominal cramping.  He states he had diarrhea since 11am. He has had 4-5 stools today He states today he feels weak. He has not eaten .   Has Remicade infusion end of this  week Appetite is good. No weight loss.  He states he feels 50% better.    Review of Systems Past Medical History:  Diagnosis Date  . Abdominal pain, periumbilical   . Crohn's disease ([place])   . Eczema   . Fatigue   . GERD (gastroesophageal reflux disease)   . Hypertension     Past Surgical History:  Procedure Laterality Date  . APPENDECTOMY    . BACK SURGERY     Cervical Neck Fusion , Low Back  . CHOLECYSTECTOMY    . COLONOSCOPY  09/07/13  . COLONOSCOPY N/A 08/14/2014   Procedure: COLONOSCOPY;  Surgeon: Rogene Houston, MD;  Location: [facility];  Service: Endoscopy;  Laterality: N/A;  . FLEXIBLE SIGMOIDOSCOPY  11/22/13    Allergies  Allergen Reactions  . Sulfa Antibiotics Itching    Current Outpatient Prescriptions on File Prior to Visit  Medication Sig Dispense Refill  . InFLIXimab (REMICADE IV) Inject 100 mg into the vein. Patient last infusion was 03/23/15. Next one due 04/20/15.    [name] loratadine (CLARITIN) 10 MG tablet Take 10 mg by mouth daily as needed.     [name] losartan (COZAAR) 50 MG tablet Take 50 mg by mouth daily.    [name] omeprazole (PRILOSEC) 20 MG capsule Take 1 capsule (20 mg total) by mouth daily. 30 capsule 5  . Probiotic Product (PROBIOTIC PO) Take by mouth daily. Gummy form     No current facility-administered medications on file prior to visit.         Objective:   Physical Exam Blood pressure 120/80, pulse 80, temperature 99 F (37.2 C), height 5'  7" (1.702 m), weight 196 lb 11.2 oz (89.2 kg). Alert and oriented. Skin warm and dry. Oral mucosa is moist.   . Sclera anicteric, conjunctivae is pink. Thyroid not enlarged. No cervical lymphadenopathy. Lungs clear. Heart regular rate and rhythm.  Abdomen is soft. Bowel sounds are positive. No hepatomegaly. No abdominal masses felt. Mid lower abdominal pain.  No edema to lower extremities.              Assessment & Plan:  Segmental colitis. Am going to get a CBC and CRP, CEA Rx for Cipro and Flagyl sent to his pharmacy

## 2016-11-03 NOTE — Patient Instructions (Addendum)
CBC and CRP Rx for Cipro and Flagyl sent to his pharmacy.

## 2016-11-04 LAB — C-REACTIVE PROTEIN: CRP: 107.3 mg/L — ABNORMAL HIGH (ref <8.0)

## 2016-11-07 ENCOUNTER — Encounter (HOSPITAL_COMMUNITY): Payer: Self-pay

## 2016-11-07 ENCOUNTER — Encounter (HOSPITAL_COMMUNITY)
Admission: RE | Admit: 2016-11-07 | Discharge: 2016-11-07 | Disposition: A | Discharge status: Home / Self Care | Payer: BLUE CROSS/BLUE SHIELD | Source: Ambulatory Visit | Attending: Internal Medicine | Admitting: Internal Medicine

## 2016-11-07 DIAGNOSIS — K50118 Crohn's disease of large intestine with other complication: Secondary | ICD-10-CM | POA: Diagnosis not present

## 2016-11-07 MED ORDER — SODIUM CHLORIDE 0.9 % IV SOLN
5.0000 mg/kg | Freq: Once | INTRAVENOUS | Status: AC
  Administered 2016-11-07: 400 mg via INTRAVENOUS
  Filled 2016-11-07: qty 40

## 2016-11-07 MED ORDER — LORATADINE 10 MG PO TABS
10.0000 mg | ORAL_TABLET | Freq: Every day | ORAL | Status: DC
  Administered 2016-11-07: 10 mg via ORAL
  Filled 2016-11-07: qty 1

## 2016-11-07 MED ORDER — ACETAMINOPHEN 325 MG PO TABS
650.0000 mg | ORAL_TABLET | Freq: Once | ORAL | Status: AC
  Administered 2016-11-07: 650 mg via ORAL
  Filled 2016-11-07: qty 2

## 2016-11-07 MED ORDER — SODIUM CHLORIDE 0.9 % IV SOLN
INTRAVENOUS | Status: DC
  Administered 2016-11-07: 250 mL via INTRAVENOUS

## 2016-11-10 ENCOUNTER — Telehealth (INDEPENDENT_AMBULATORY_CARE_PROVIDER_SITE_OTHER): Payer: Self-pay | Admitting: Internal Medicine

## 2016-11-10 DIAGNOSIS — K5010 Crohn's disease of large intestine without complications: Secondary | ICD-10-CM

## 2016-11-10 NOTE — Telephone Encounter (Signed)
Patient feels much better. CRP ordered for Thursdya

## 2016-11-13 DIAGNOSIS — K5010 Crohn's disease of large intestine without complications: Secondary | ICD-10-CM | POA: Diagnosis not present

## 2016-11-14 LAB — C-REACTIVE PROTEIN: CRP: 4.2 mg/L (ref <8.0)

## 2016-11-23 ENCOUNTER — Other Ambulatory Visit (INDEPENDENT_AMBULATORY_CARE_PROVIDER_SITE_OTHER): Payer: Self-pay | Admitting: Internal Medicine

## 2017-01-02 ENCOUNTER — Encounter (HOSPITAL_COMMUNITY)
Admission: RE | Admit: 2017-01-02 | Discharge: 2017-01-02 | Disposition: A | Discharge status: Home / Self Care | Payer: BLUE CROSS/BLUE SHIELD | Source: Ambulatory Visit | Attending: Internal Medicine | Admitting: Internal Medicine

## 2017-01-02 DIAGNOSIS — K5792 Diverticulitis of intestine, part unspecified, without perforation or abscess without bleeding: Secondary | ICD-10-CM | POA: Insufficient documentation

## 2017-01-02 DIAGNOSIS — K5090 Crohn's disease, unspecified, without complications: Secondary | ICD-10-CM | POA: Insufficient documentation

## 2017-01-02 MED ORDER — INFLIXIMAB 100 MG IV SOLR
5.0000 mg/kg | Freq: Once | INTRAVENOUS | Status: AC
  Administered 2017-01-02: 500 mg via INTRAVENOUS
  Filled 2017-01-02: qty 50

## 2017-01-02 MED ORDER — SODIUM CHLORIDE 0.9 % IV SOLN
Freq: Once | INTRAVENOUS | Status: AC
  Administered 2017-01-02: 250 mL via INTRAVENOUS

## 2017-01-02 NOTE — Progress Notes (Signed)
Pt states took tylenol and claritin at home before coming to hospital.

## 2017-02-27 ENCOUNTER — Encounter (HOSPITAL_COMMUNITY): Payer: Self-pay

## 2017-02-27 ENCOUNTER — Encounter (HOSPITAL_COMMUNITY)
Admission: RE | Admit: 2017-02-27 | Discharge: 2017-02-27 | Disposition: A | Discharge status: Home / Self Care | Payer: BLUE CROSS/BLUE SHIELD | Source: Ambulatory Visit | Attending: Internal Medicine | Admitting: Internal Medicine

## 2017-02-27 DIAGNOSIS — K5090 Crohn's disease, unspecified, without complications: Secondary | ICD-10-CM | POA: Diagnosis not present

## 2017-02-27 MED ORDER — SODIUM CHLORIDE 0.9 % IV SOLN
400.0000 mg | Freq: Once | INTRAVENOUS | Status: AC
  Administered 2017-02-27: 400 mg via INTRAVENOUS
  Filled 2017-02-27: qty 40

## 2017-02-27 MED ORDER — SODIUM CHLORIDE 0.9 % IV SOLN
INTRAVENOUS | Status: DC
  Administered 2017-02-27: 08:00:00 via INTRAVENOUS

## 2017-04-21 ENCOUNTER — Ambulatory Visit (INDEPENDENT_AMBULATORY_CARE_PROVIDER_SITE_OTHER): Payer: BLUE CROSS/BLUE SHIELD | Admitting: Internal Medicine

## 2017-04-21 ENCOUNTER — Encounter (INDEPENDENT_AMBULATORY_CARE_PROVIDER_SITE_OTHER): Payer: Self-pay

## 2017-04-21 ENCOUNTER — Encounter (INDEPENDENT_AMBULATORY_CARE_PROVIDER_SITE_OTHER): Payer: Self-pay | Admitting: Internal Medicine

## 2017-04-21 VITALS — BP 150/100 | HR 67 | Temp 98.1°F | Resp 18 | Ht 67.0 in | Wt 202.4 lb

## 2017-04-21 DIAGNOSIS — K5010 Crohn's disease of large intestine without complications: Secondary | ICD-10-CM | POA: Diagnosis not present

## 2017-04-21 DIAGNOSIS — K219 Gastro-esophageal reflux disease without esophagitis: Secondary | ICD-10-CM | POA: Diagnosis not present

## 2017-04-21 NOTE — Patient Instructions (Addendum)
Notify if GERD symptoms relapse despite dietary changes. Increase physical activity.  Walk at least 3 times a week for minimum of 30 minutes each time.

## 2017-04-21 NOTE — Progress Notes (Signed)
Presenting complaint;  Follow-up for IBD/segmental colitis associated with diverticulosis.  Database and subjective:  Nicholas Ellison is a 61 year old Caucasian male who was diagnosed with segmental colitis associated with diverticulosis back in April 2016 when he presented with abdominal pain rectal bleeding diarrhea and recurrent fever.  He has been maintained on infliximab ever since.  He is here for scheduled visit.  He was last seen in July this year.  He continues to do well.  He has anywhere from 1-3 bowel movements.  All of his stools are formed.  On most days he has 1 or 2.  He denies abdominal pain melena or rectal bleeding.  At times he wakes up with mild discomfort in the morning and as soon as he has a bowel movement this sensation goes away.  He has 20-year history of GERD.  Lately he has been having postprandial fullness and regurgitation at night.  He has changed his eating habits.  He tries to eat his supper before 7 it usually is very light.  He does not experience heartburn anymore.  He is never undergone EGD.  He also complains of bloating.  He states he eats a lot of bread.  He has occasional nausea but no vomiting.  He is status post cholecystectomy.    Current Medications: Outpatient Encounter Medications as of 04/21/2017  Medication Sig  . InFLIXimab (REMICADE IV) Inject 100 mg into the vein. Patient last infusion was 03/23/15. Next one due 04/20/15.  [name] loratadine (CLARITIN) 10 MG tablet Take 10 mg by mouth daily as needed.   [name] losartan (COZAAR) 50 MG tablet Take 50 mg by mouth daily.  [name] omeprazole (PRILOSEC) 20 MG capsule TAKE ONE CAPSULE BY MOUTH ONCE DAILY  . Probiotic Product (PROBIOTIC PO) Take by mouth daily. Gummy form  . [DISCONTINUED] ciprofloxacin (CIPRO) 500 MG tablet Take 1 tablet (500 mg total) by mouth 2 (two) times daily. (Patient not taking: Reported on 04/21/2017)  . [DISCONTINUED] metroNIDAZOLE (FLAGYL) 250 MG tablet Take 1 tablet (250 mg total) by mouth 3 (three)  times daily. (Patient not taking: Reported on 04/21/2017)   No facility-administered encounter medications on file as of 04/21/2017.      Objective: Blood pressure (!) 150/100, pulse 67, temperature 98.1 F (36.7 C), temperature source Oral, resp. rate 18, height 5' 7"  (1.702 m), weight 202 lb 6.4 oz (91.8 kg). Patient is alert and in no acute distress. He has small scar at nasal tip as well as little scaly erythematous area close to bridge of his nose. Conjunctiva is pink. Sclera is nonicteric Oropharyngeal mucosa is normal. No neck masses or thyromegaly noted. Cardiac exam with regular rhythm normal S1 and S2. No murmur or gallop noted. Lungs are clear to auscultation. Abdomen is full.  On palpation is soft and nontender without organomegaly or masses. No LE edema or clubbing noted.  Labs/studies Results:  CRP on 11/03/2016 was 107.3 CRP on 11/13/2016 was 4.2.  CBC from 11/03/2016 WBC 8.0 H&H 13.1 and 38.7 and platelet count 223K.   Assessment:  #1.  Segmental colitis associated with diverticulosis diagnosed over 2-1/2 years ago.  He is on infliximab and remains in remission.  Will continue therapy as long as it is working and he is not having any side effects.  #2.  GERD.  He has had symptoms of GERD for 20 years.  He is on PPI.  Recent flareup has responded to dietary measures.  If symptoms relapse he may consider diagnostic EGD.   Plan:  Patient  advised to increase physical activity such as walking 3-4 times a week for minimum of 30 minutes. Patient will call if GERD symptoms relapse despite therapy. Continue infliximab at current schedule of every 8 weeks. Office visit in 6 months.

## 2017-04-24 ENCOUNTER — Encounter (HOSPITAL_COMMUNITY): Payer: Self-pay

## 2017-04-24 ENCOUNTER — Encounter (HOSPITAL_COMMUNITY)
Admission: RE | Admit: 2017-04-24 | Discharge: 2017-04-24 | Disposition: A | Discharge status: Home / Self Care | Payer: BLUE CROSS/BLUE SHIELD | Source: Ambulatory Visit | Attending: Internal Medicine | Admitting: Internal Medicine

## 2017-04-24 DIAGNOSIS — K5790 Diverticulosis of intestine, part unspecified, without perforation or abscess without bleeding: Secondary | ICD-10-CM | POA: Diagnosis not present

## 2017-04-24 DIAGNOSIS — K50918 Crohn's disease, unspecified, with other complication: Secondary | ICD-10-CM | POA: Diagnosis not present

## 2017-04-24 DIAGNOSIS — K50118 Crohn's disease of large intestine with other complication: Secondary | ICD-10-CM | POA: Insufficient documentation

## 2017-04-24 MED ORDER — SODIUM CHLORIDE 0.9 % IV SOLN
5.0000 mg/kg | Freq: Once | INTRAVENOUS | Status: AC
  Administered 2017-04-24: 500 mg via INTRAVENOUS
  Filled 2017-04-24: qty 50

## 2017-04-24 MED ORDER — SODIUM CHLORIDE 0.9 % IV SOLN: INTRAVENOUS | Status: DC

## 2017-04-24 MED ORDER — LORATADINE 10 MG PO TABS: 10.0000 mg | ORAL_TABLET | Freq: Once | ORAL | Status: DC

## 2017-04-24 MED ORDER — ACETAMINOPHEN 325 MG PO TABS: 650.0000 mg | ORAL_TABLET | Freq: Once | ORAL | Status: DC

## 2017-04-24 NOTE — Progress Notes (Signed)
Patient pre medicated with Claritin and tylenol prior to arrival

## 2017-05-08 DIAGNOSIS — Z23 Encounter for immunization: Secondary | ICD-10-CM | POA: Diagnosis not present

## 2017-05-25 ENCOUNTER — Other Ambulatory Visit (INDEPENDENT_AMBULATORY_CARE_PROVIDER_SITE_OTHER): Payer: Self-pay | Admitting: Internal Medicine

## 2017-06-26 ENCOUNTER — Encounter (HOSPITAL_COMMUNITY)
Admission: RE | Admit: 2017-06-26 | Discharge: 2017-06-26 | Disposition: A | Discharge status: Home / Self Care | Payer: BLUE CROSS/BLUE SHIELD | Source: Ambulatory Visit | Attending: Internal Medicine | Admitting: Internal Medicine

## 2017-06-26 ENCOUNTER — Encounter (HOSPITAL_COMMUNITY): Payer: Self-pay

## 2017-06-26 DIAGNOSIS — K5010 Crohn's disease of large intestine without complications: Secondary | ICD-10-CM | POA: Insufficient documentation

## 2017-06-26 MED ORDER — ACETAMINOPHEN 325 MG PO TABS: 650.0000 mg | ORAL_TABLET | Freq: Once | ORAL | Status: DC

## 2017-06-26 MED ORDER — SODIUM CHLORIDE 0.9 % IV SOLN
Freq: Once | INTRAVENOUS | Status: AC
  Administered 2017-06-26: 09:00:00 via INTRAVENOUS

## 2017-06-26 MED ORDER — LORATADINE 10 MG PO TABS: 10.0000 mg | ORAL_TABLET | Freq: Once | ORAL | Status: DC

## 2017-06-26 MED ORDER — SODIUM CHLORIDE 0.9 % IV SOLN
5.0000 mg/kg | INTRAVENOUS | Status: DC
  Administered 2017-06-26: 500 mg via INTRAVENOUS
  Filled 2017-06-26: qty 50

## 2017-06-26 NOTE — Progress Notes (Signed)
Tolerated infusion without complications.  Appointment made for 8 weeks.

## 2017-08-20 ENCOUNTER — Encounter (HOSPITAL_COMMUNITY)
Admission: RE | Admit: 2017-08-20 | Discharge: 2017-08-20 | Disposition: A | Discharge status: Home / Self Care | Payer: BLUE CROSS/BLUE SHIELD | Source: Ambulatory Visit | Attending: Internal Medicine | Admitting: Internal Medicine

## 2017-08-22 ENCOUNTER — Encounter (HOSPITAL_COMMUNITY): Payer: BLUE CROSS/BLUE SHIELD

## 2017-08-27 ENCOUNTER — Encounter (HOSPITAL_COMMUNITY)
Admission: RE | Admit: 2017-08-27 | Discharge: 2017-08-27 | Disposition: A | Discharge status: Home / Self Care | Payer: BLUE CROSS/BLUE SHIELD | Source: Ambulatory Visit | Attending: Internal Medicine | Admitting: Internal Medicine

## 2017-08-27 ENCOUNTER — Encounter (HOSPITAL_COMMUNITY): Payer: Self-pay

## 2017-08-27 DIAGNOSIS — K5790 Diverticulosis of intestine, part unspecified, without perforation or abscess without bleeding: Secondary | ICD-10-CM | POA: Insufficient documentation

## 2017-08-27 DIAGNOSIS — K50118 Crohn's disease of large intestine with other complication: Secondary | ICD-10-CM | POA: Diagnosis not present

## 2017-08-27 DIAGNOSIS — K50918 Crohn's disease, unspecified, with other complication: Secondary | ICD-10-CM | POA: Diagnosis not present

## 2017-08-27 MED ORDER — LORATADINE 10 MG PO TABS: 10.0000 mg | ORAL_TABLET | Freq: Every day | ORAL | Status: DC

## 2017-08-27 MED ORDER — SODIUM CHLORIDE 0.9 % IV SOLN
500.0000 mg | Freq: Once | INTRAVENOUS | Status: AC
  Administered 2017-08-27: 500 mg via INTRAVENOUS
  Filled 2017-08-27: qty 50

## 2017-08-27 MED ORDER — SODIUM CHLORIDE 0.9 % IV SOLN
Freq: Once | INTRAVENOUS | Status: AC
  Administered 2017-08-27: 08:00:00 via INTRAVENOUS

## 2017-08-27 MED ORDER — ACETAMINOPHEN 325 MG PO TABS: 650.0000 mg | ORAL_TABLET | Freq: Four times a day (QID) | ORAL | Status: DC | PRN

## 2017-10-20 ENCOUNTER — Encounter (INDEPENDENT_AMBULATORY_CARE_PROVIDER_SITE_OTHER): Payer: Self-pay | Admitting: Internal Medicine

## 2017-10-20 ENCOUNTER — Ambulatory Visit (INDEPENDENT_AMBULATORY_CARE_PROVIDER_SITE_OTHER): Payer: BLUE CROSS/BLUE SHIELD | Admitting: Internal Medicine

## 2017-10-20 VITALS — BP 132/88 | HR 63 | Temp 97.9°F | Resp 18 | Ht 67.0 in | Wt 198.4 lb

## 2017-10-20 DIAGNOSIS — K5010 Crohn's disease of large intestine without complications: Secondary | ICD-10-CM | POA: Diagnosis not present

## 2017-10-20 NOTE — Patient Instructions (Signed)
Notify if you have rectal bleeding or abdominal pain.

## 2017-10-20 NOTE — Progress Notes (Signed)
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  He was last seen in December 2018. He feels he is doing well.  He has formed stool daily.  He has sporadic lower abdominal pain which does not last long.  At times he feels he has constipation.  He wonders if he can take OTC MiraLAX.  He denies melena or rectal bleeding.  He may experience tiredness for 1 to 2 days after infliximab infusion but he has no other side effects.  He has good appetite.  He has lost 4 pounds since his last visit and he is happy to know that. He states he is due for complete blood count next month. Patient is planning to retire next year.  He feels he may not be able to afford infliximab after his retirement.  He is interested in other options.    Current Medications: Outpatient Encounter Medications as of 10/20/2017  Medication Sig  . acetaminophen (TYLENOL) 325 MG tablet Take 650 mg by mouth once.  . InFLIXimab (REMICADE IV) Inject 100 mg into the vein. Patient last infusion was 03/23/15. Next one due 04/20/15.  [name] loratadine (CLARITIN) 10 MG tablet Take 10 mg by mouth daily as needed.   [name] losartan (COZAAR) 50 MG tablet Take 50 mg by mouth daily.  [name] omeprazole (PRILOSEC) 20 MG capsule TAKE 1 CAPSULE BY MOUTH ONCE DAILY  . Probiotic Product (PROBIOTIC PO) Take by mouth daily. Gummy form   No facility-administered encounter medications on file as of 10/20/2017.      Objective: Blood pressure 132/88, pulse 63, temperature 97.9 F (36.6 C), temperature source Oral, resp. rate 18, height 5' 7"  (1.702 m), weight 198 lb 6.4 oz (90 kg). Patient is alert and in no acute distress. Conjunctiva is pink. Sclera is nonicteric Oropharyngeal mucosa is normal. No neck  masses or thyromegaly noted. Cardiac exam with regular rhythm normal S1 and S2. No murmur or gallop noted. Lungs are clear to auscultation. Abdomen is full but soft and nontender without organomegaly or masses. No LE edema or clubbing noted.  Labs/studies Results: Lab data from 11/13/2016  WBC 8.0, H&H 13.1 and 38.7 and platelet count 223K.  CRP 4.2.   Assessment:  #1.  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  He has been on infliximab for 2 years and appears to be in remission. Since he has concerns about cost of infliximab as well as side effects it would be worthwhile to consider colonoscopy next year to document endoscopic remission at which time he could be switched to oral mesalamine and infliximab could be stopped.   Plan:  Patient can take OTC MiraLAX on as-needed basis. He will have Quantiferon- TB Gold with-blood work next month. Office visit in 6 months.

## 2017-10-22 ENCOUNTER — Encounter (HOSPITAL_COMMUNITY): Payer: Self-pay

## 2017-10-22 ENCOUNTER — Encounter (HOSPITAL_COMMUNITY)
Admission: RE | Admit: 2017-10-22 | Discharge: 2017-10-22 | Disposition: A | Discharge status: Home / Self Care | Payer: BLUE CROSS/BLUE SHIELD | Source: Ambulatory Visit | Attending: Internal Medicine | Admitting: Internal Medicine

## 2017-10-22 DIAGNOSIS — K50118 Crohn's disease of large intestine with other complication: Secondary | ICD-10-CM | POA: Insufficient documentation

## 2017-10-22 DIAGNOSIS — K5790 Diverticulosis of intestine, part unspecified, without perforation or abscess without bleeding: Secondary | ICD-10-CM | POA: Insufficient documentation

## 2017-10-22 DIAGNOSIS — K50918 Crohn's disease, unspecified, with other complication: Secondary | ICD-10-CM | POA: Diagnosis not present

## 2017-10-22 MED ORDER — SODIUM CHLORIDE 0.9 % IV SOLN
Freq: Once | INTRAVENOUS | Status: AC
  Administered 2017-10-22: 08:00:00 via INTRAVENOUS

## 2017-10-22 MED ORDER — SODIUM CHLORIDE 0.9 % IV SOLN
500.0000 mg | Freq: Once | INTRAVENOUS | Status: AC
  Administered 2017-10-22: 500 mg via INTRAVENOUS
  Filled 2017-10-22: qty 50

## 2017-11-12 DIAGNOSIS — R5382 Chronic fatigue, unspecified: Secondary | ICD-10-CM | POA: Diagnosis not present

## 2017-11-12 DIAGNOSIS — E782 Mixed hyperlipidemia: Secondary | ICD-10-CM | POA: Diagnosis not present

## 2017-11-12 DIAGNOSIS — I10 Essential (primary) hypertension: Secondary | ICD-10-CM | POA: Diagnosis not present

## 2017-11-12 DIAGNOSIS — K210 Gastro-esophageal reflux disease with esophagitis: Secondary | ICD-10-CM | POA: Diagnosis not present

## 2017-11-19 DIAGNOSIS — K210 Gastro-esophageal reflux disease with esophagitis: Secondary | ICD-10-CM | POA: Diagnosis not present

## 2017-11-19 DIAGNOSIS — E782 Mixed hyperlipidemia: Secondary | ICD-10-CM | POA: Diagnosis not present

## 2017-11-19 DIAGNOSIS — I10 Essential (primary) hypertension: Secondary | ICD-10-CM | POA: Diagnosis not present

## 2017-11-19 DIAGNOSIS — G5792 Unspecified mononeuropathy of left lower limb: Secondary | ICD-10-CM | POA: Diagnosis not present

## 2017-12-10 DIAGNOSIS — D485 Neoplasm of uncertain behavior of skin: Secondary | ICD-10-CM | POA: Diagnosis not present

## 2017-12-10 DIAGNOSIS — L570 Actinic keratosis: Secondary | ICD-10-CM | POA: Diagnosis not present

## 2017-12-10 DIAGNOSIS — B353 Tinea pedis: Secondary | ICD-10-CM | POA: Diagnosis not present

## 2017-12-14 ENCOUNTER — Other Ambulatory Visit (INDEPENDENT_AMBULATORY_CARE_PROVIDER_SITE_OTHER): Payer: Self-pay | Admitting: Internal Medicine

## 2017-12-17 ENCOUNTER — Encounter (HOSPITAL_COMMUNITY): Payer: Self-pay

## 2017-12-17 ENCOUNTER — Encounter (HOSPITAL_COMMUNITY)
Admission: RE | Admit: 2017-12-17 | Discharge: 2017-12-17 | Disposition: A | Discharge status: Home / Self Care | Payer: BLUE CROSS/BLUE SHIELD | Source: Ambulatory Visit | Attending: Internal Medicine | Admitting: Internal Medicine

## 2017-12-17 DIAGNOSIS — K5790 Diverticulosis of intestine, part unspecified, without perforation or abscess without bleeding: Secondary | ICD-10-CM | POA: Insufficient documentation

## 2017-12-17 DIAGNOSIS — K50118 Crohn's disease of large intestine with other complication: Secondary | ICD-10-CM | POA: Insufficient documentation

## 2017-12-17 DIAGNOSIS — K50918 Crohn's disease, unspecified, with other complication: Secondary | ICD-10-CM | POA: Diagnosis not present

## 2017-12-17 MED ORDER — SODIUM CHLORIDE 0.9 % IV SOLN
5.0000 mg/kg | Freq: Once | INTRAVENOUS | Status: AC
  Administered 2017-12-17: 400 mg via INTRAVENOUS
  Filled 2017-12-17: qty 40

## 2017-12-17 MED ORDER — SODIUM CHLORIDE 0.9 % IV SOLN
Freq: Once | INTRAVENOUS | Status: AC
  Administered 2017-12-17: 09:00:00 via INTRAVENOUS

## 2018-01-11 DIAGNOSIS — C44319 Basal cell carcinoma of skin of other parts of face: Secondary | ICD-10-CM | POA: Diagnosis not present

## 2018-01-11 DIAGNOSIS — L905 Scar conditions and fibrosis of skin: Secondary | ICD-10-CM | POA: Diagnosis not present

## 2018-01-11 DIAGNOSIS — C44219 Basal cell carcinoma of skin of left ear and external auricular canal: Secondary | ICD-10-CM | POA: Diagnosis not present

## 2018-01-11 DIAGNOSIS — D485 Neoplasm of uncertain behavior of skin: Secondary | ICD-10-CM | POA: Diagnosis not present

## 2018-01-11 DIAGNOSIS — C44311 Basal cell carcinoma of skin of nose: Secondary | ICD-10-CM | POA: Diagnosis not present

## 2018-02-01 DIAGNOSIS — C44311 Basal cell carcinoma of skin of nose: Secondary | ICD-10-CM | POA: Diagnosis not present

## 2018-02-09 DIAGNOSIS — Z23 Encounter for immunization: Secondary | ICD-10-CM | POA: Diagnosis not present

## 2018-02-11 ENCOUNTER — Encounter (HOSPITAL_COMMUNITY): Payer: BLUE CROSS/BLUE SHIELD

## 2018-02-15 DIAGNOSIS — Z48817 Encounter for surgical aftercare following surgery on the skin and subcutaneous tissue: Secondary | ICD-10-CM | POA: Diagnosis not present

## 2018-02-15 DIAGNOSIS — Z4802 Encounter for removal of sutures: Secondary | ICD-10-CM | POA: Diagnosis not present

## 2018-02-15 DIAGNOSIS — Z483 Aftercare following surgery for neoplasm: Secondary | ICD-10-CM | POA: Diagnosis not present

## 2018-02-25 ENCOUNTER — Encounter (HOSPITAL_COMMUNITY)
Admission: RE | Admit: 2018-02-25 | Discharge: 2018-02-25 | Disposition: A | Discharge status: Home / Self Care | Payer: BLUE CROSS/BLUE SHIELD | Source: Ambulatory Visit | Attending: Internal Medicine | Admitting: Internal Medicine

## 2018-02-25 DIAGNOSIS — K5790 Diverticulosis of intestine, part unspecified, without perforation or abscess without bleeding: Secondary | ICD-10-CM | POA: Diagnosis not present

## 2018-02-25 DIAGNOSIS — K50118 Crohn's disease of large intestine with other complication: Secondary | ICD-10-CM | POA: Diagnosis not present

## 2018-02-25 DIAGNOSIS — K50918 Crohn's disease, unspecified, with other complication: Secondary | ICD-10-CM | POA: Diagnosis not present

## 2018-02-25 MED ORDER — SODIUM CHLORIDE 0.9 % IV SOLN
Freq: Once | INTRAVENOUS | Status: AC
  Administered 2018-02-25: 10:00:00 via INTRAVENOUS

## 2018-02-25 MED ORDER — LORATADINE 10 MG PO TABS: 10.0000 mg | ORAL_TABLET | Freq: Once | ORAL | Status: DC

## 2018-02-25 MED ORDER — ACETAMINOPHEN 325 MG PO TABS: 650.0000 mg | ORAL_TABLET | Freq: Once | ORAL | Status: DC

## 2018-02-25 MED ORDER — SODIUM CHLORIDE 0.9 % IV SOLN
400.0000 mg | Freq: Once | INTRAVENOUS | Status: AC
  Administered 2018-02-25: 400 mg via INTRAVENOUS
  Filled 2018-02-25: qty 40

## 2018-04-18 ENCOUNTER — Encounter (HOSPITAL_COMMUNITY): Payer: BLUE CROSS/BLUE SHIELD

## 2018-04-22 ENCOUNTER — Encounter (HOSPITAL_COMMUNITY)
Admission: RE | Admit: 2018-04-22 | Discharge: 2018-04-22 | Disposition: A | Discharge status: Home / Self Care | Payer: BLUE CROSS/BLUE SHIELD | Source: Ambulatory Visit | Attending: Internal Medicine | Admitting: Internal Medicine

## 2018-04-22 DIAGNOSIS — K5790 Diverticulosis of intestine, part unspecified, without perforation or abscess without bleeding: Secondary | ICD-10-CM | POA: Diagnosis not present

## 2018-04-22 DIAGNOSIS — K50118 Crohn's disease of large intestine with other complication: Secondary | ICD-10-CM | POA: Diagnosis not present

## 2018-04-22 DIAGNOSIS — K50918 Crohn's disease, unspecified, with other complication: Secondary | ICD-10-CM | POA: Diagnosis not present

## 2018-04-22 MED ORDER — LORATADINE 10 MG PO TABS
10.0000 mg | ORAL_TABLET | Freq: Every day | ORAL | Status: DC
  Filled 2018-04-22: qty 1

## 2018-04-22 MED ORDER — SODIUM CHLORIDE 0.9 % IV SOLN
INTRAVENOUS | Status: DC
  Administered 2018-04-22: 08:00:00 via INTRAVENOUS

## 2018-04-22 MED ORDER — SODIUM CHLORIDE 0.9 % IV SOLN
400.0000 mg | Freq: Once | INTRAVENOUS | Status: AC
  Administered 2018-04-22: 400 mg via INTRAVENOUS
  Filled 2018-04-22: qty 40

## 2018-04-22 MED ORDER — ACETAMINOPHEN 325 MG PO TABS
650.0000 mg | ORAL_TABLET | Freq: Once | ORAL | Status: DC
  Filled 2018-04-22: qty 2

## 2018-05-11 ENCOUNTER — Ambulatory Visit (INDEPENDENT_AMBULATORY_CARE_PROVIDER_SITE_OTHER): Payer: BLUE CROSS/BLUE SHIELD | Admitting: Internal Medicine

## 2018-05-11 ENCOUNTER — Encounter (INDEPENDENT_AMBULATORY_CARE_PROVIDER_SITE_OTHER): Payer: Self-pay | Admitting: Internal Medicine

## 2018-05-11 VITALS — BP 120/90 | HR 66 | Temp 97.8°F | Resp 18 | Ht 67.0 in | Wt 196.8 lb

## 2018-05-11 DIAGNOSIS — K5010 Crohn's disease of large intestine without complications: Secondary | ICD-10-CM

## 2018-05-11 NOTE — Progress Notes (Signed)
Presenting complaint;  Follow-up for segmental colitis/IBD.  Database and subjective:  [age] year old Caucasian male he has over 4-year history of segmental colitis involving sigmoid colon who has been on infliximab infusion for maintenance.  He was last seen 6 months ago. He returns for scheduled visit. He continues to do well.  He says he has 1-2 formed stools per day.  He has occasional lower abdominal pain which is mild and not prolonged.  He feels pain occurs usually a day or 2 after Remicade but he does not have any bleeding.  He does not have any other side effects.  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  He wonders if these cancers are associated with infliximab. He is planning to retire within a year and is concerned if you would be able to afford this medication.  Current Medications: Outpatient Encounter Medications as of 05/11/2018  Medication Sig  . acetaminophen (TYLENOL) 325 MG tablet Take 650 mg by mouth once.  . InFLIXimab (REMICADE IV) Inject 100 mg into the vein. Patient last infusion was 04/22/2018.  . loratadine (CLARITIN) 10 MG tablet Take 10 mg by mouth daily as needed.   [name] losartan (COZAAR) 50 MG tablet Take 50 mg by mouth daily.  [name] omeprazole (PRILOSEC) 20 MG capsule TAKE 1 CAPSULE BY MOUTH ONCE DAILY  . Probiotic Product (PROBIOTIC PO) Take by mouth daily. Gummy form   No facility-administered encounter medications on file as of 05/11/2018.      Objective: Blood pressure 120/90, pulse 66, temperature 97.8 F (36.6 C), temperature source Oral, resp. rate 18, height 5' 7"  (1.702 m), weight 196 lb 12.8 oz (89.3 kg). Patient is alert and in no acute distress. Conjunctiva is pink. Sclera is nonicteric Oropharyngeal mucosa is normal. No neck masses or thyromegaly noted. Cardiac exam with regular rhythm normal  S1 and S2. No murmur or gallop noted. Lungs are clear to auscultation. Abdomen is full but soft and nontender without organomegaly or masses. No LE edema or clubbing noted.  Labs/studies Results:  QuantiFERON TB Gold plus negative on November 12, 2017 CBC and comprehensive chemistry panel normal.  These have been scanned into the chart.  Assessment:  #1.  Segmental colitis involving sigmoid colon/ulcerative colitis.  He remains in remission on infliximab.  He is not having any side effects with infliximab.   Plan:  He will have CBC and comprehensive chemistry panel by Dr. Quintin Alto within the next couple of months. He will continue infliximab infusion for maintenance every 8 weeks. Patient will call if he has any side effects or symptoms suggest relapse of his disease. Will consider colonoscopy next year/at 5-year mark to assess disease activity before any changes to his treatment plan. Office visit in 6 months.

## 2018-05-11 NOTE — Patient Instructions (Signed)
Please remind Dr. Edythe Lynn office to send [place] a copy of your next blood work.

## 2018-06-01 DIAGNOSIS — C44219 Basal cell carcinoma of skin of left ear and external auricular canal: Secondary | ICD-10-CM | POA: Diagnosis not present

## 2018-06-15 ENCOUNTER — Other Ambulatory Visit (INDEPENDENT_AMBULATORY_CARE_PROVIDER_SITE_OTHER): Payer: Self-pay | Admitting: Internal Medicine

## 2018-06-17 ENCOUNTER — Encounter (HOSPITAL_COMMUNITY): Payer: Self-pay

## 2018-06-17 ENCOUNTER — Encounter (HOSPITAL_COMMUNITY)
Admission: RE | Admit: 2018-06-17 | Discharge: 2018-06-17 | Disposition: A | Discharge status: Home / Self Care | Payer: BLUE CROSS/BLUE SHIELD | Source: Ambulatory Visit | Attending: Internal Medicine | Admitting: Internal Medicine

## 2018-06-17 DIAGNOSIS — K5790 Diverticulosis of intestine, part unspecified, without perforation or abscess without bleeding: Secondary | ICD-10-CM | POA: Diagnosis not present

## 2018-06-17 DIAGNOSIS — K50918 Crohn's disease, unspecified, with other complication: Secondary | ICD-10-CM | POA: Diagnosis not present

## 2018-06-17 DIAGNOSIS — K50118 Crohn's disease of large intestine with other complication: Secondary | ICD-10-CM | POA: Diagnosis not present

## 2018-06-17 MED ORDER — SODIUM CHLORIDE 0.9 % IV SOLN
400.0000 mg | Freq: Once | INTRAVENOUS | Status: AC
  Administered 2018-06-17: 400 mg via INTRAVENOUS
  Filled 2018-06-17: qty 40

## 2018-06-17 MED ORDER — LORATADINE 10 MG PO TABS
10.0000 mg | ORAL_TABLET | Freq: Once | ORAL | Status: DC
Start: 2018-06-17 — End: 2018-06-18

## 2018-06-17 MED ORDER — SODIUM CHLORIDE 0.9 % IV SOLN
Freq: Once | INTRAVENOUS | Status: AC
  Administered 2018-06-17: 09:00:00 via INTRAVENOUS

## 2018-06-17 MED ORDER — ACETAMINOPHEN 325 MG PO TABS: 650.0000 mg | ORAL_TABLET | Freq: Once | ORAL | Status: DC

## 2018-08-12 ENCOUNTER — Encounter (HOSPITAL_COMMUNITY): Payer: BLUE CROSS/BLUE SHIELD

## 2018-08-12 ENCOUNTER — Telehealth (INDEPENDENT_AMBULATORY_CARE_PROVIDER_SITE_OTHER): Payer: Self-pay | Admitting: *Deleted

## 2018-08-12 NOTE — Telephone Encounter (Signed)
Nicholas Ellison called the office on 08/10/2018. He expressed his concerns about getting his scheduled Remicade Infusion. Concerned that his immune system is weak and with the CoVid -19 , he just does feel good about this.  He ask that this be addresses with Dr.Rehman. Per Dr.Rehman -his in agreement. However , the patient knows what to look for and is he experiences any of this and or sees blood he to call us ASAP.  Patient was made aware and he had already canceled his infusion with [facility]- short Stay.

## 2018-11-16 ENCOUNTER — Encounter (INDEPENDENT_AMBULATORY_CARE_PROVIDER_SITE_OTHER): Payer: Self-pay | Admitting: Internal Medicine

## 2018-11-16 ENCOUNTER — Ambulatory Visit (INDEPENDENT_AMBULATORY_CARE_PROVIDER_SITE_OTHER): Payer: BLUE CROSS/BLUE SHIELD | Admitting: Internal Medicine

## 2018-11-16 ENCOUNTER — Other Ambulatory Visit: Payer: Self-pay

## 2018-11-16 VITALS — BP 125/84 | HR 80 | Temp 98.3°F | Resp 18 | Ht 67.0 in | Wt 197.3 lb

## 2018-11-16 DIAGNOSIS — K5010 Crohn's disease of large intestine without complications: Secondary | ICD-10-CM | POA: Diagnosis not present

## 2018-11-16 NOTE — Progress Notes (Signed)
Presenting complaint;  Follow-up for segmental colitis.  Database and subjective:  Nicholas Ellison is 63 year old Caucasian male who is here for scheduled visit.  He was last seen on 05/11/2018. He has a history of segmental colitis associated with diverticulosis, variant of inflammatory bowel disease who was initially evaluated in March 2016.  He was treated with infliximab starting in October 2016.  He responded very quickly to infliximab and has been maintained on infusion every 8 weeks.  He has remained in remission all this time. His last infusion was on 06/17/2018.  He called [place] few days prior to his scheduled dose in April 2020 that he wanted to stop the medication because of ongoing COVID-19 pandemic. He states he has not had any relapse of his symptoms.  He had one self-limiting bout of diarrhea without fever pain or rectal bleeding.  Now he is having formed stool daily.  He has good appetite.  He also has not noted any change in frequency of his bowel movements.  His appetite is good.  He is still working. He does not take any NSAIDs.  Current Medications: Outpatient Encounter Medications as of 11/16/2018  Medication Sig  . acetaminophen (TYLENOL) 325 MG tablet Take 650 mg by mouth once.  [name] losartan (COZAAR) 50 MG tablet Take 50 mg by mouth daily.  [name] omeprazole (PRILOSEC) 20 MG capsule TAKE 1 CAPSULE BY MOUTH ONCE DAILY  . Probiotic Product (PROBIOTIC PO) Take by mouth daily. Gummy form  . [DISCONTINUED] InFLIXimab (REMICADE IV) Inject 100 mg into the vein. Patient last infusion was 04/22/2018.  . [DISCONTINUED] loratadine (CLARITIN) 10 MG tablet Take 10 mg by mouth daily as needed.    No facility-administered encounter medications on file as of 11/16/2018.      Objective: Blood pressure 125/84, pulse 80, temperature 98.3 F (36.8 C), temperature source Oral, resp. rate 18, height 5' 7"  (1.702 m), weight 197 lb 4.8 oz (89.5 kg). Patient is alert and in no acute distress. He has right facial  and nasal scars. Conjunctiva is pink. Sclera is nonicteric Oropharyngeal mucosa is normal. No neck masses or thyromegaly noted. Cardiac exam with regular rhythm normal S1 and S2. No murmur or gallop noted. Lungs are clear to auscultation. Abdomen is full but soft and nontender with no organomegaly or masses. No LE edema or clubbing noted.   Assessment:  History of segmental colitis associated with diverticulosis. Patient's last infliximab dose was 5 months ago.  He appears to remain in remission and I hope this is the case so that he would not have to go back on the medication. Patient is due to have blood work by Dr. Quintin Alto next week.   Plan:  Patient will have CRP with his next next week with rest of his blood test. Patient will call office if diarrhea abdominal pain or rectal bleeding recurs. Patient advised to avoid NSAIDs and manage stress as best as possible. Office visit in 1 year.

## 2018-11-16 NOTE — Patient Instructions (Signed)
Physician will call with results of your blood work when completed. Notify if you have abdominal pain diarrhea or rectal bleeding.

## 2018-11-18 DIAGNOSIS — Z0001 Encounter for general adult medical examination with abnormal findings: Secondary | ICD-10-CM | POA: Diagnosis not present

## 2018-11-22 DIAGNOSIS — Z0001 Encounter for general adult medical examination with abnormal findings: Secondary | ICD-10-CM | POA: Diagnosis not present

## 2019-01-20 ENCOUNTER — Other Ambulatory Visit (INDEPENDENT_AMBULATORY_CARE_PROVIDER_SITE_OTHER): Payer: Self-pay | Admitting: *Deleted

## 2019-01-20 DIAGNOSIS — K219 Gastro-esophageal reflux disease without esophagitis: Secondary | ICD-10-CM

## 2019-01-20 MED ORDER — OMEPRAZOLE 20 MG PO CPDR: 20.0000 mg | DELAYED_RELEASE_CAPSULE | Freq: Every day | ORAL | 6 refills | Status: DC

## 2019-06-09 ENCOUNTER — Ambulatory Visit (INDEPENDENT_AMBULATORY_CARE_PROVIDER_SITE_OTHER): Payer: BC Managed Care – PPO | Admitting: Internal Medicine

## 2019-06-09 ENCOUNTER — Other Ambulatory Visit: Payer: Self-pay

## 2019-06-09 ENCOUNTER — Encounter (INDEPENDENT_AMBULATORY_CARE_PROVIDER_SITE_OTHER): Payer: Self-pay | Admitting: Internal Medicine

## 2019-06-09 VITALS — BP 151/101 | HR 73 | Temp 97.3°F | Ht 67.0 in | Wt 202.1 lb

## 2019-06-09 DIAGNOSIS — K219 Gastro-esophageal reflux disease without esophagitis: Secondary | ICD-10-CM | POA: Diagnosis not present

## 2019-06-09 DIAGNOSIS — K5010 Crohn's disease of large intestine without complications: Secondary | ICD-10-CM

## 2019-06-09 MED ORDER — METAMUCIL SMOOTH TEXTURE 58.6 % PO POWD: 1.0000 | Freq: Every day | ORAL | 12 refills | Status: DC

## 2019-06-09 NOTE — Progress Notes (Signed)
Presenting complaint;  Follow-up for segmental colitis(IBD).  Database and subjective:  Patient is 64 year old Caucasian male who has history of segmental colitis associated with diverticulosis which was diagnosed back in March 2016.  He was begun on infliximab in October 2016.  He immediately went into remission.  He has done well all these years.  He wanted to come off infliximab.  It was stopped 1 year ago.  He was last seen in July 2020 and was doing well. He continues to do well.  He generally has formed stool daily.  If he skips a day then he may have to the following day.  At times his stool is hard.  He denies rectal bleeding melena or rectal discharge.  He also denies abdominal pain.  He does complain of frequent burping and intermittent heartburn.  He is on omeprazole 20 mg daily which he takes every morning but does not wait 30 minutes before eating.  He is not having any side effects with PPI. His appetite is good.  He has gained 5 pounds since his last visit. He states he has been under a lot of stress.  His father who is 37 years old died of Covid pneumonia last week.  His mother also had it but she has recovered. Patient does not take OTC NSAIDs. Patient has not had any blood work since his last office visit.  He says he will have complete blood count at the time of office visit with Dr. Consuello Masse.  Current Medications: Outpatient Encounter Medications as of 06/09/2019  Medication Sig  . acetaminophen (TYLENOL) 325 MG tablet Take 650 mg by mouth once.  [name] losartan (COZAAR) 50 MG tablet Take 50 mg by mouth daily.  [name] omeprazole (PRILOSEC) 20 MG capsule Take 1 capsule (20 mg total) by mouth daily.  . Probiotic Product (PROBIOTIC PO) Take by mouth daily. Gummy form   No facility-administered encounter medications on file as of 06/09/2019.     Objective: Blood pressure (!) 151/101, pulse 73, temperature (!) 97.3 F (36.3 C), temperature source Temporal, height 5' 7"  (1.702 m),  weight 202 lb 1.6 oz (91.7 kg). Patient is alert and in no acute distress. Patient is wearing a facial mask. Conjunctiva is pink. Sclera is nonicteric Oropharyngeal mucosa is normal. No neck masses or thyromegaly noted. Cardiac exam with regular rhythm normal S1 and S2. No murmur or gallop noted. Lungs are clear to auscultation. Abdomen is full.  On palpation it is soft and nontender with organomegaly or masses No LE edema or clubbing noted.  Assessment:  #1.  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  Patient remains in remission.  He has been off Remicade/infliximab for 1 year.  #2.  GERD.  Patient advised to take PPI at least 30 minutes before his breakfast.  Plan:  Once again patient informed not to take any NSAIDs on regular basis.  Sporadic use is allowed.  He should just stick with Tylenol as needed. Metamucil or equivalent 4 g by mouth daily at bedtime. Office visit in 1 year unless patient develops diarrhea and or rectal bleeding.

## 2019-06-09 NOTE — Patient Instructions (Addendum)
Notify if you have diarrhea or rectal bleeding. Remember to take omeprazole/Prilosec 30 minutes before breakfast daily. Metamucil 1 heaping tablespoonful or 4 g daily.  Can substitute it with fiber Gummies or other fiber supplements. Can use OTC simethicone or Phazyme for flatulence on as-needed basis.

## 2019-07-25 ENCOUNTER — Other Ambulatory Visit (INDEPENDENT_AMBULATORY_CARE_PROVIDER_SITE_OTHER): Payer: Self-pay | Admitting: *Deleted

## 2019-07-25 ENCOUNTER — Telehealth (INDEPENDENT_AMBULATORY_CARE_PROVIDER_SITE_OTHER): Payer: Self-pay | Admitting: Internal Medicine

## 2019-07-25 DIAGNOSIS — K219 Gastro-esophageal reflux disease without esophagitis: Secondary | ICD-10-CM

## 2019-07-25 MED ORDER — OMEPRAZOLE 20 MG PO CPDR: 20.0000 mg | DELAYED_RELEASE_CAPSULE | Freq: Every day | ORAL | 5 refills | Status: DC

## 2019-07-25 NOTE — Telephone Encounter (Signed)
Patient called stated he needs refill on his omeprazole - please advise - ph# [phone]

## 2019-07-25 NOTE — Telephone Encounter (Signed)
Please let the patient know that we have sent in a medication refill.

## 2019-08-18 DIAGNOSIS — M2011 Hallux valgus (acquired), right foot: Secondary | ICD-10-CM | POA: Diagnosis not present

## 2019-08-18 DIAGNOSIS — M722 Plantar fascial fibromatosis: Secondary | ICD-10-CM | POA: Diagnosis not present

## 2019-08-18 DIAGNOSIS — M2012 Hallux valgus (acquired), left foot: Secondary | ICD-10-CM | POA: Diagnosis not present

## 2019-08-18 DIAGNOSIS — D1631 Benign neoplasm of short bones of right lower limb: Secondary | ICD-10-CM | POA: Diagnosis not present

## 2019-09-06 DIAGNOSIS — M722 Plantar fascial fibromatosis: Secondary | ICD-10-CM | POA: Diagnosis not present

## 2019-10-20 DIAGNOSIS — M722 Plantar fascial fibromatosis: Secondary | ICD-10-CM | POA: Diagnosis not present

## 2019-11-18 DIAGNOSIS — I10 Essential (primary) hypertension: Secondary | ICD-10-CM | POA: Diagnosis not present

## 2019-11-18 DIAGNOSIS — E782 Mixed hyperlipidemia: Secondary | ICD-10-CM | POA: Diagnosis not present

## 2019-11-18 DIAGNOSIS — K5090 Crohn's disease, unspecified, without complications: Secondary | ICD-10-CM | POA: Diagnosis not present

## 2019-11-18 DIAGNOSIS — Z0001 Encounter for general adult medical examination with abnormal findings: Secondary | ICD-10-CM | POA: Diagnosis not present

## 2019-11-22 ENCOUNTER — Ambulatory Visit (INDEPENDENT_AMBULATORY_CARE_PROVIDER_SITE_OTHER): Payer: BC Managed Care – PPO | Admitting: Internal Medicine

## 2019-11-24 DIAGNOSIS — Z0001 Encounter for general adult medical examination with abnormal findings: Secondary | ICD-10-CM | POA: Diagnosis not present

## 2019-12-13 ENCOUNTER — Ambulatory Visit (INDEPENDENT_AMBULATORY_CARE_PROVIDER_SITE_OTHER): Payer: BC Managed Care – PPO | Admitting: Internal Medicine

## 2020-02-20 ENCOUNTER — Other Ambulatory Visit (INDEPENDENT_AMBULATORY_CARE_PROVIDER_SITE_OTHER): Payer: Self-pay | Admitting: Internal Medicine

## 2020-02-20 DIAGNOSIS — K219 Gastro-esophageal reflux disease without esophagitis: Secondary | ICD-10-CM

## 2020-03-18 ENCOUNTER — Other Ambulatory Visit (INDEPENDENT_AMBULATORY_CARE_PROVIDER_SITE_OTHER): Payer: Self-pay | Admitting: Gastroenterology

## 2020-03-18 DIAGNOSIS — K219 Gastro-esophageal reflux disease without esophagitis: Secondary | ICD-10-CM

## 2020-04-16 ENCOUNTER — Other Ambulatory Visit (INDEPENDENT_AMBULATORY_CARE_PROVIDER_SITE_OTHER): Payer: Self-pay

## 2020-04-16 DIAGNOSIS — K219 Gastro-esophageal reflux disease without esophagitis: Secondary | ICD-10-CM

## 2020-04-16 MED ORDER — OMEPRAZOLE 20 MG PO CPDR: 20.0000 mg | DELAYED_RELEASE_CAPSULE | Freq: Every day | ORAL | 5 refills | Status: DC

## 2020-06-12 ENCOUNTER — Other Ambulatory Visit: Payer: Self-pay

## 2020-06-12 ENCOUNTER — Ambulatory Visit (INDEPENDENT_AMBULATORY_CARE_PROVIDER_SITE_OTHER): Payer: Medicare Other | Admitting: Internal Medicine

## 2020-06-12 ENCOUNTER — Encounter (INDEPENDENT_AMBULATORY_CARE_PROVIDER_SITE_OTHER): Payer: Self-pay | Admitting: Internal Medicine

## 2020-06-12 VITALS — BP 155/89 | HR 90 | Temp 98.4°F | Ht 67.0 in | Wt 211.6 lb

## 2020-06-12 DIAGNOSIS — K50111 Crohn's disease of large intestine with rectal bleeding: Secondary | ICD-10-CM

## 2020-06-12 DIAGNOSIS — K219 Gastro-esophageal reflux disease without esophagitis: Secondary | ICD-10-CM

## 2020-06-12 MED ORDER — OMEPRAZOLE 20 MG PO CPDR: 20.0000 mg | DELAYED_RELEASE_CAPSULE | Freq: Every day | ORAL | 3 refills | Status: DC

## 2020-06-12 NOTE — Progress Notes (Signed)
Presenting complaint;  Follow for ulcerative colitis and GERD.  Database and subjective:  Patient is 65 year old Caucasian male who is here for scheduled visit.  Is symptoms started in 2015.  He had colonoscopy by Dr. Doristine Mango in May 2015 but definite diagnosis of IBD was made on colonoscopy of April 2016.[name]  He was treated for the infliximab until 2 years ago when he was decided to stop the medication.  He he has not had relapse of his condition. He was last seen on 06/09/2019 and was doing well. He has no complaints.  He has 1 formed stool daily.  He denies melena or rectal bleeding.  He notices mild cramping in the morning quickly relieved with defecation.  His appetite is very good.  He has gained 9 pounds since his last visit.  He he is not doing any regular exercise.  He feels heartburn is well controlled with PPI.  He does get breakthrough symptoms if he stops the medication for a day or so.  He feels there is room for improvement in his diets.  He denies dysphagia nausea or vomiting. He states he had blood work last year and reportedly everything was normal.  He will have blood work at the time of his visit with Dr. Consuello Masse.  Current Medications: Outpatient Encounter Medications as of 06/12/2020  Medication Sig  . acetaminophen (TYLENOL) 325 MG tablet Take 650 mg by mouth once.  [name] losartan (COZAAR) 50 MG tablet Take 50 mg by mouth daily.  [name] omeprazole (PRILOSEC) 20 MG capsule Take 1 capsule (20 mg total) by mouth daily.  . Probiotic Product (PROBIOTIC PO) Take by mouth daily. Gummy form (Patient not taking: Reported on 06/12/2020)  . [DISCONTINUED] psyllium (METAMUCIL SMOOTH TEXTURE) 58.6 % powder Take 1 packet by mouth daily. (Patient not taking: Reported on 06/12/2020)   No facility-administered encounter medications on file as of 06/12/2020.     Objective: Blood pressure (!) 155/89, pulse 90, temperature 98.4 F (36.9 C), temperature source Oral, height 5' 7"  (1.702 m), weight  211 lb 9.6 oz (96 kg). Patient is alert and in no acute distress. He is wearing a mask. Conjunctiva is pink. Sclera is nonicteric Oropharyngeal mucosa is normal. No neck masses or thyromegaly noted. Cardiac exam with regular rhythm normal S1 and S2. No murmur or gallop noted. Lungs are clear to auscultation. Abdomen is full with hernia of linea alba and upper mid abdomen.  On palpation abdomen is soft and nontender with organomegaly or masses. No LE edema or clubbing noted.  Labs/studies Results:  No recent lab data. Comprehensive chemistry panel was normal on 11/12/2017 scanned into epic.  Assessment:  #1.  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  Since he had remained in remission for all these years he wanted to come off the medication primarily because of cost.  Luckily he has remained in remission. His symptoms started a year earlier.  We will plan surveillance colonoscopy in May 2025 unless his symptoms relapse. Patient is aware not to take OTC NSAIDs.  #2.  Chronic GERD.  He is doing well with therapy.  Not ready for dose change.  Plan:  New prescription for omeprazole 20 mg p.o. every morning sent to patient's pharmacy for 90 days with 3 refills. Patient will remind [place] when he has next blood work. Patient will call office if he has diarrhea or rectal bleeding otherwise return for office visit  in 1 year.

## 2020-06-12 NOTE — Patient Instructions (Signed)
Please call office if experience diarrhea and or rectal bleeding

## 2021-06-07 ENCOUNTER — Other Ambulatory Visit (INDEPENDENT_AMBULATORY_CARE_PROVIDER_SITE_OTHER): Payer: Self-pay | Admitting: Gastroenterology

## 2021-06-07 DIAGNOSIS — K219 Gastro-esophageal reflux disease without esophagitis: Secondary | ICD-10-CM

## 2021-10-08 ENCOUNTER — Encounter (INDEPENDENT_AMBULATORY_CARE_PROVIDER_SITE_OTHER): Payer: Self-pay | Admitting: Internal Medicine

## 2021-10-08 ENCOUNTER — Ambulatory Visit (INDEPENDENT_AMBULATORY_CARE_PROVIDER_SITE_OTHER): Payer: Medicare Other | Admitting: Internal Medicine

## 2021-10-08 VITALS — BP 146/91 | HR 72 | Temp 97.8°F | Ht 67.0 in | Wt 205.0 lb

## 2021-10-08 DIAGNOSIS — Z8719 Personal history of other diseases of the digestive system: Secondary | ICD-10-CM

## 2021-10-08 DIAGNOSIS — K219 Gastro-esophageal reflux disease without esophagitis: Secondary | ICD-10-CM

## 2021-10-08 MED ORDER — OMEPRAZOLE 20 MG PO CPDR: 20.0000 mg | DELAYED_RELEASE_CAPSULE | Freq: Every day | ORAL | 2 refills | Status: DC

## 2021-10-08 NOTE — Progress Notes (Signed)
Presenting complaint;  History of distal ulcerative colitis(SCAD).  Database and subjective:  Patient is 66 year old Caucasian male who has history of distal ulcerative colitis(segmental colitis associated with diverticulosis) diagnosed in April 26.  He was treated with infliximab and immediately went into remission.  He wanted to come off therapy because of the cost and the fact he was retiring.  He has been off infliximab for 3 years. He also history of GERD. He states he is doing very well.  He has 1 formed stool daily.  He denies melena or rectal bleeding.  He has occasional fleeting lower abdominal pain.  Heartburn is well controlled with PPI.  He denies dysphagia sore throat chronic cough or hoarseness. He has not had any blood work this year but he is scheduled to have blood work by Dr. Quintin Alto next month. Patient states he has a large garden.  He walks every day.  Current Medications: Outpatient Encounter Medications as of 10/08/2021  Medication Sig   losartan (COZAAR) 50 MG tablet Take 50 mg by mouth daily.   omeprazole (PRILOSEC) 20 MG capsule Take 1 capsule by mouth once daily   Probiotic Product (PROBIOTIC PO) Take by mouth daily. Gummy form   [DISCONTINUED] acetaminophen (TYLENOL) 325 MG tablet Take 650 mg by mouth once.   No facility-administered encounter medications on file as of 10/08/2021.     Objective: Blood pressure (!) 146/91, pulse 72, temperature 97.8 F (36.6 C), temperature source Oral, height 5' 7"  (1.702 m), weight 205 lb (93 kg). Patient is alert and in no acute distress. Conjunctiva is pink. Sclera is nonicteric Oropharyngeal mucosa is normal. No neck masses or thyromegaly noted. Cardiac exam with regular rhythm normal S1 and S2. No murmur or gallop noted. Lungs are clear to auscultation. Abdomen is full but soft and nontender with organomegaly or masses. No LE edema or clubbing noted.  Labs/studies Results:  No recent lab data on  file.   Assessment:  #1.  Chronic GERD.  He is doing well with therapy.  #2.  History of distal ulcerative colitis.  He was treated with infliximab for about 4 years and he has been off medication for 3 years and continues to do well.  He was diagnosed with this condition in 2016.  He should therefore consider surveillance colonoscopy in 2026.  Plan:  Patient will call office if he develops diarrhea or rectal bleeding. New prescription given for omeprazole 20 mg p.o. every morning 90 doses with 2 refills.  He should therefore have enough until his next visit in 1 year. Patient will bring [place] a copy of his blood work that he will have next month. Office visit in 1 year.

## 2021-10-08 NOTE — Patient Instructions (Signed)
Not ify if you have rectal bleeding or diarrhea. Remember to get [place] a copy of your planned blood work

## 2021-10-14 ENCOUNTER — Encounter (INDEPENDENT_AMBULATORY_CARE_PROVIDER_SITE_OTHER): Payer: Self-pay | Admitting: Internal Medicine

## 2022-08-06 ENCOUNTER — Encounter (INDEPENDENT_AMBULATORY_CARE_PROVIDER_SITE_OTHER): Payer: Self-pay | Admitting: Gastroenterology

## 2022-10-09 ENCOUNTER — Ambulatory Visit (INDEPENDENT_AMBULATORY_CARE_PROVIDER_SITE_OTHER): Payer: Medicare Other | Admitting: Gastroenterology

## 2022-11-27 ENCOUNTER — Ambulatory Visit (INDEPENDENT_AMBULATORY_CARE_PROVIDER_SITE_OTHER): Payer: Medicare Other | Admitting: Gastroenterology

## 2022-11-27 ENCOUNTER — Encounter (INDEPENDENT_AMBULATORY_CARE_PROVIDER_SITE_OTHER): Payer: Self-pay | Admitting: Gastroenterology

## 2022-11-27 VITALS — BP 136/80 | Temp 97.7°F | Ht 67.0 in | Wt 213.0 lb

## 2022-11-27 DIAGNOSIS — K219 Gastro-esophageal reflux disease without esophagitis: Secondary | ICD-10-CM

## 2022-11-27 DIAGNOSIS — R140 Abdominal distension (gaseous): Secondary | ICD-10-CM | POA: Diagnosis not present

## 2022-11-27 MED ORDER — PANTOPRAZOLE SODIUM 40 MG PO TBEC: 40.0000 mg | DELAYED_RELEASE_TABLET | Freq: Every day | ORAL | 1 refills | Status: DC

## 2022-11-27 NOTE — Patient Instructions (Signed)
Please stop omeprazole and start pantoprazole once daily, take this in the morning prior to eating You can also try starting a daily probiotic, this can be purchased over the counter, generic branding is fine, aim for one with atleast 2-3 strains of bacteria in it Be mindful of greasy, spicy, fried, citrus foods, caffeine, carbonated drinks, chocolate and alcohol as these can increase reflux symptoms Stay upright 2-3 hours after eating, prior to lying down and avoid eating late in the evenings.  Also be mindful that raw/roughage type foods such as broccoli, cauliflower, lettuce, cabbage, beans etc. Are higher in fiber and can cause more gas and bloating You can try taking an over the counter gas pill such as phazyme or gas x as needed, especially when consuming these type of foods  Follow up 6 months  It was a pleasure to see you today. I want to create trusting relationships with patients and provide genuine, compassionate, and quality care. I truly value your feedback! please be on the lookout for a survey regarding your visit with me today. I appreciate your input about our visit and your time in completing this!    Lexander Tremblay L. Jeanmarie Hubert, MSN, APRN, AGNP-C Adult-Gerontology Nurse Practitioner [facility]

## 2022-11-27 NOTE — Progress Notes (Signed)
Referring Provider: Estanislado Pandy, MD Primary Care Physician:  Estanislado Pandy, MD Primary GI Physician: previously Dr. Karilyn Cota (Dr. Tasia Catchings)  Chief Complaint  Patient presents with   Gastroesophageal Reflux    Follow up on GERD. Having a lot of burping and bloating. Takes omeprazole once daily.    HPI:   Nicholas Ellison is a 66 y.o. 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   History:   Notably he was seen at [facility] for suspected SCAD but repeat Colonoscopy biopsies at that time more indicative of Crohn's, therefore he was referred back to Dr. Karilyn Cota and started on Humira. Previously treated with infliximab for Crohn's vs (SCAD) in 2016 which he achieved remission on?(No repeat colonoscopy done thereafter)  and therefore went off of therapy in 2020 due to cost.  Present: Patient states he is on omeprazole 20mg  daily. No breakthrough heartburn or acid regurgitation. He notes that he has frequent bloating and belching that has been ongoing for some time. He notes this can occur with most anything he eats but especially worse with raw foods. No nausea or vomiting. No abdominal pain. He states he is trying to eat smaller portioned meals which he was instructed to do previously. He denies any weight loss. No constipation or diarrhea. No rectal bleeding or melena. Having a BM usually once per day. Notably has history of Celiac disease listed in his chart but he denies any history of this he is aware of.    Last Colonoscopy: 12/2014 changes in the sigmoid colon but not in a sending the transverse colon Left colon biopsy colonic mucosa with no diagnostic abnormality. Sigmoid colon biopsy. Colonic mucosa with chronic active inflammation most consistent with  segmental colitis associated with diverticulosis.   Recommendations:    Past Medical History:  Diagnosis Date   Abdominal pain, periumbilical    Celiac disease    Crohn's disease (HCC)    Eczema    Fatigue    GERD (gastroesophageal reflux disease)    Hypertension     Past Surgical History:  Procedure Laterality Date   APPENDECTOMY     BACK SURGERY     Cervical Neck Fusion , Low Back   CHOLECYSTECTOMY     COLONOSCOPY  09/07/13   COLONOSCOPY N/A 08/14/2014   Procedure: COLONOSCOPY;  Surgeon: Malissa Hippo, MD;  [facility];  Service: Endoscopy;  Laterality: N/A;   FLEXIBLE SIGMOIDOSCOPY  11/22/13    Current Outpatient Medications  Medication Sig Dispense Refill   losartan (COZAAR) 50 MG tablet Take 50 mg by mouth daily.     omeprazole (PRILOSEC) 20 MG capsule Take 1 capsule (20 mg total) by mouth daily. 90 capsule 2   Probiotic Product (PROBIOTIC PO) Take by mouth daily. Gummy form (Patient not taking: Reported on 11/27/2022)     No current facility-administered medications for this visit.    Allergies as of 11/27/2022 - Review Complete 11/27/2022  Allergen Reaction Noted   Sulfa antibiotics Itching 08/01/2014    Family History  Problem Relation Age of Onset   Hypertension Mother    Diabetes Mother    Hypertension Father    Healthy Sister    Healthy Brother    Healthy Sister    Healthy  Sister    Bone cancer Sister    Healthy Paternal Grandfather     Social History   Socioeconomic History   Marital status: Married    Spouse name: Not on file   Number of children: Not on file   Years of education: Not on file   Highest education level: Not on file  Occupational History   Not on file  Tobacco Use   Smoking status: Former    Passive exposure: Past   Smokeless tobacco: Current    Types: Snuff  Vaping Use   Vaping status: Never Used  Substance and Sexual Activity   Alcohol use: No    Alcohol/week: 0.0 standard drinks of alcohol   Drug  use: No   Sexual activity: Yes  Other Topics Concern   Not on file  Social History Narrative   Not on file   Social Determinants of Health   Financial Resource Strain: Not on file  Food Insecurity: Not on file  Transportation Needs: Not on file  Physical Activity: Not on file  Stress: Not on file  Social Connections: Not on file    Review of systems General: negative for malaise, night sweats, fever, chills, weight loss Neck: Negative for lumps, goiter, pain and significant neck swelling Resp: Negative for cough, wheezing, dyspnea at rest CV: Negative for chest pain, leg swelling, palpitations, orthopnea GI: denies melena, hematochezia, nausea, vomiting, diarrhea, constipation, dysphagia, odyonophagia, early satiety or unintentional weight loss. +bloating/belching  MSK: Negative for joint pain or swelling, back pain, and muscle pain. Derm: Negative for itching or rash Psych: Denies depression, anxiety, memory loss, confusion. No homicidal or suicidal ideation.  Heme: Negative for prolonged bleeding, bruising easily, and swollen nodes. Endocrine: Negative for cold or heat intolerance, polyuria, polydipsia and goiter. Neuro: negative for tremor, gait imbalance, syncope and seizures. The remainder of the review of systems is noncontributory.  Physical Exam: BP 136/80 (BP Location: Right Arm, Patient Position: Sitting, Cuff Size: Large)   Temp 97.7 F (36.5 C) (Oral)   Ht 5\' 7"  (1.702 m)   Wt 213 lb (96.6 kg)   BMI 33.36 kg/m  General:   Alert and oriented. No distress noted. Pleasant and cooperative.  Head:  Normocephalic and atraumatic. Eyes:  Conjuctiva clear without scleral icterus. Mouth:  Oral mucosa pink and moist. Good dentition. No lesions. Heart: Normal rate and rhythm, s1 and s2 heart sounds present.  Lungs: Clear lung sounds in all lobes. Respirations equal and unlabored. Abdomen:  +BS, soft, non-tender and non-distended. No rebound or guarding. No HSM or masses  noted. Derm: No palmar erythema or jaundice Msk:  Symmetrical without gross deformities. Normal posture. Extremities:  Without edema. Neurologic:  Alert and  oriented x4 Psych:  Alert and cooperative. Normal mood and affect.  Invalid input(s): "6 MONTHS"   ASSESSMENT: Nicholas Ellison is a 67 y.o. male presenting today for follow up of GERD with belching and bloating.   Patient maintained on omeprazole 20mg  daily for the past few years. He denies heartburn, acid regurgitation, abdominal pain or dysphagia but is having frequent belching/bloating. No constipation or diarrhea. He is s/p cholecystectomy in the past. Notes he does not tolerate raw/roughage foods well. Discussed belching/bloating may be secondary to uncontrolled GERD, will stop omeprazole and start protonix 40mg  daily, he was instructed on good reflux precautions. He may also benefit from starting a daily probiotic as well. As he has history of celiac disease listed in his chart (he has no knowledge of this  diagnosis in the past and no labs in chart to indicate), I discussed proceeding with celiac testing, however, patient declined at this time. He will make me aware if symptoms are not improving, may need to consider further evaluation with EGD at that time.   Patient also with history significant for possible Crohn's disease vs. SCAD, treated with 4 year course of infliximab which was stopped in 2020 with achieved resolution of GI symptoms. No colonoscopy thereafter. He has no abdominal pain, rectal bleeding, melena, diarrhea. Will discuss timing of next Colonoscopy with Dr. Tasia Catchings, as it is not quite clear of exact diagnosis at that time.   PLAN:  Will discuss timing of next colonoscopy with Dr. Tasia Catchings  2.  Stop omeprazole  3. Start protonix 40mg  once daily  4. Start daily probiotic  5. Can take phazyme/gas x especially with raw/roughage foods   All questions were answered, patient verbalized understanding and is in agreement  with plan as outlined above.   Follow Up: 6 months   Benn Tarver L. Jeanmarie Hubert, MSN, APRN, AGNP-C Adult-Gerontology Nurse Practitioner [facility]

## 2023-05-19 ENCOUNTER — Other Ambulatory Visit (INDEPENDENT_AMBULATORY_CARE_PROVIDER_SITE_OTHER): Payer: Self-pay | Admitting: Gastroenterology

## 2023-06-01 ENCOUNTER — Ambulatory Visit (INDEPENDENT_AMBULATORY_CARE_PROVIDER_SITE_OTHER): Payer: Medicare Other | Admitting: Gastroenterology

## 2023-06-01 ENCOUNTER — Encounter (INDEPENDENT_AMBULATORY_CARE_PROVIDER_SITE_OTHER): Payer: Self-pay | Admitting: Gastroenterology

## 2023-06-01 VITALS — BP 137/82 | HR 80 | Temp 97.6°F | Ht 67.0 in | Wt 194.1 lb

## 2023-06-01 DIAGNOSIS — K219 Gastro-esophageal reflux disease without esophagitis: Secondary | ICD-10-CM | POA: Diagnosis not present

## 2023-06-01 NOTE — Patient Instructions (Signed)
Please continue with protonix 40mg  daily Continue with dietary changes and getting plenty of exercise  We will plan to see you back in 1 year unless you have any new GI issues  It was a pleasure to see you today. I want to create trusting relationships with patients and provide genuine, compassionate, and quality care. I truly value your feedback! please be on the lookout for a survey regarding your visit with me today. I appreciate your input about our visit and your time in completing this!    Alzada Brazee L. Jeanmarie Hubert, MSN, APRN, AGNP-C Adult-Gerontology Nurse Practitioner [facility] at [facility]

## 2023-06-01 NOTE — Progress Notes (Signed)
Referring Provider: Estanislado Pandy, MD Primary Care Physician:  Estanislado Pandy, MD Primary GI Physician: Dr. Tasia Catchings   Chief Complaint  Patient presents with   Gastroesophageal Reflux    Follow up on GERD. Takes protonix. States doing well and no concerns.     HPI:   Nicholas Ellison is a 68 y.o. 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  Reports GERD symptoms well-controlled though having frequent belching and bloating that has been ongoing for some time.  This seems worse with certain foods such as raw foods.  Try to eat smaller portions.  Has history of celiac disease listed in his chart but he denies any history of this that he is aware of.  Stop omeprazole, start Protonix 40 mg daily, start daily probiotic can take Phazyme or Gas-X for gas.   Present: States he is feeling better since being on protonix 40mg  daily. Feels bloating has improved.  He has also lost some weight cutting out sweets to include sweet tea and avoiding breads. 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 Has a BM usually daily, very rare episode of diarrhea. He is taking probiotic when he can remember. No GI complaints today. No red flag symptoms. Patient denies melena, hematochezia, nausea, vomiting, diarrhea, constipation, dysphagia, odyonophagia, early satiety or weight loss.   Last Colonoscopy:12/2014 changes in the sigmoid colon but not in a sending the transverse colon Left colon biopsy colonic mucosa with no diagnostic abnormality. Sigmoid colon biopsy. Colonic mucosa with chronic active inflammation most consistent with segmental colitis associated with diverticulosis.    Past Medical History:  Diagnosis Date   Abdominal pain, periumbilical    Celiac disease    Crohn's disease (HCC)    Eczema    Fatigue    GERD (gastroesophageal reflux  disease)    Hypertension     Past Surgical History:  Procedure Laterality Date   APPENDECTOMY     BACK SURGERY     Cervical Neck Fusion , Low Back   CHOLECYSTECTOMY     COLONOSCOPY  09/07/13   COLONOSCOPY N/A 08/14/2014   Procedure: COLONOSCOPY;  Surgeon: Malissa Hippo, MD;  [facility];  Service: Endoscopy;  Laterality: N/A;   FLEXIBLE SIGMOIDOSCOPY  11/22/13    Current Outpatient Medications  Medication Sig Dispense Refill   losartan (COZAAR) 50 MG tablet Take 50 mg by mouth daily.     pantoprazole (PROTONIX) 40 MG tablet Take 1 tablet by mouth once daily 90 tablet 0   Probiotic Product (PROBIOTIC PO) Take by mouth daily. Gummy form     No current facility-administered medications for this visit.    Allergies as of 06/01/2023 - Review Complete 06/01/2023  Allergen Reaction Noted   Sulfa antibiotics Itching 08/01/2014    Family History  Problem Relation Age of Onset   Hypertension Mother    Diabetes Mother    Hypertension Father    Healthy Sister    Healthy Brother    Healthy Sister    Healthy Sister    Bone cancer Sister    Healthy Paternal Grandfather     Social History   Socioeconomic History   Marital status: Married    Spouse name: Not on file   Number of children: Not on file  Years of education: Not on file   Highest education level: Not on file  Occupational History   Not on file  Tobacco Use   Smoking status: Former    Passive exposure: Past   Smokeless tobacco: Current    Types: Snuff  Vaping Use   Vaping status: Never Used  Substance and Sexual Activity   Alcohol use: No    Alcohol/week: 0.0 standard drinks of alcohol   Drug use: No   Sexual activity: Yes  Other Topics Concern   Not on file  Social History Narrative   Not on file   Social Drivers of Health   Financial Resource Strain: Not on file  Food Insecurity: Not on file  Transportation Needs: Not on file  Physical Activity: Not on file  Stress: Not on file   Social Connections: Not on file    Review of systems General: negative for malaise, night sweats, fever, chills, weight loss Neck: Negative for lumps, goiter, pain and significant neck swelling Resp: Negative for cough, wheezing, dyspnea at rest CV: Negative for chest pain, leg swelling, palpitations, orthopnea GI: denies melena, hematochezia, nausea, vomiting, diarrhea, constipation, dysphagia, odyonophagia, early satiety or unintentional weight loss.  The remainder of the review of systems is noncontributory.  Physical Exam: BP 137/82   Pulse 80   Temp 97.6 F (36.4 C) (Oral)   Ht 5\' 7"  (1.702 m)   Wt 194 lb 1.6 oz (88 kg)   BMI 30.40 kg/m  General:   Alert and oriented. No distress noted. Pleasant and cooperative.  Head:  Normocephalic and atraumatic. Eyes:  Conjuctiva clear without scleral icterus. Mouth:  Oral mucosa pink and moist. Good dentition. No lesions. Heart: Normal rate and rhythm, s1 and s2 heart sounds present.  Lungs: Clear lung sounds in all lobes. Respirations equal and unlabored. Abdomen:  +BS, soft, non-tender and non-distended. No rebound or guarding. No HSM or masses noted. Neurologic:  Alert and  oriented x4 Psych:  Alert and cooperative. Normal mood and affect.  Invalid input(s): "6 MONTHS"   ASSESSMENT: Nicholas Ellison is a 68 y.o. male presenting today for follow up of GERD and bloating  GERD well controlled and bloating improved with use of protonix 40mg  daily, he has also lost some weight by changing his diet which he attributes to him feeling better as well. He has no GI complaints today. Will continue with current regimen.  Notably has history of possible UC vs. SCAD, last TCS in 2016 with chronic active inflammation most consistent with SCAD. Recommended by Dr. Karilyn Cota previously to have repeat TCS in 10 years.   PLAN:  -continue with protonix 40mg  daily -continue with dietary changes and good weight management -repeat Colonoscopy in  16109  All questions were answered, patient verbalized understanding and is in agreement with plan as outlined above.   Follow Up: 1 yea   Hydia Copelin L. Jeanmarie Hubert, MSN, APRN, AGNP-C Adult-Gerontology Nurse Practitioner [facility]

## 2023-08-20 ENCOUNTER — Other Ambulatory Visit (INDEPENDENT_AMBULATORY_CARE_PROVIDER_SITE_OTHER): Payer: Self-pay | Admitting: Gastroenterology

## 2024-02-22 ENCOUNTER — Other Ambulatory Visit (INDEPENDENT_AMBULATORY_CARE_PROVIDER_SITE_OTHER): Payer: Self-pay | Admitting: Gastroenterology

## 2024-03-09 ENCOUNTER — Encounter (INDEPENDENT_AMBULATORY_CARE_PROVIDER_SITE_OTHER): Payer: Self-pay | Admitting: Gastroenterology

## 2024-05-18 ENCOUNTER — Other Ambulatory Visit (INDEPENDENT_AMBULATORY_CARE_PROVIDER_SITE_OTHER): Payer: Self-pay | Admitting: Gastroenterology

## 2024-05-18 NOTE — Telephone Encounter (Signed)
 Patient last seen 06/01/2023. He will need an appointment for future refills. Please have him reach out to the office to schedule an appointment. Thanks

## 2024-06-03 ENCOUNTER — Encounter (INDEPENDENT_AMBULATORY_CARE_PROVIDER_SITE_OTHER): Payer: Self-pay | Admitting: Gastroenterology
